# Patient Record
Sex: Female | Born: 1985 | Race: White | Hispanic: No | Marital: Married | Smoking: Never smoker
Health system: Southern US, Community
[De-identification: ages and names within clinical notes are randomized; demographics above are authoritative.]

## PROBLEM LIST (undated history)

## (undated) DIAGNOSIS — O24419 Gestational diabetes mellitus in pregnancy, unspecified control: Secondary | ICD-10-CM

## (undated) HISTORY — PX: OTHER SURGICAL HISTORY: SHX169

---

## 2009-12-27 ENCOUNTER — Emergency Department: Payer: Self-pay | Admitting: Emergency Medicine

## 2010-01-11 ENCOUNTER — Emergency Department: Payer: Self-pay | Admitting: Unknown Physician Specialty

## 2010-10-23 ENCOUNTER — Ambulatory Visit: Payer: Self-pay | Admitting: Family Medicine

## 2011-03-13 ENCOUNTER — Ambulatory Visit: Payer: Self-pay | Admitting: Obstetrics and Gynecology

## 2011-03-16 ENCOUNTER — Inpatient Hospital Stay: Payer: Self-pay | Admitting: Obstetrics and Gynecology

## 2012-10-20 ENCOUNTER — Ambulatory Visit: Payer: Self-pay | Admitting: Family Medicine

## 2012-11-04 ENCOUNTER — Observation Stay: Payer: Self-pay | Admitting: Obstetrics and Gynecology

## 2012-11-04 LAB — URINALYSIS, COMPLETE
Bacteria: NONE SEEN
Blood: NEGATIVE
Glucose,UR: NEGATIVE mg/dL (ref 0–75)
Ph: 6 (ref 4.5–8.0)
Protein: NEGATIVE
WBC UR: 1 /HPF (ref 0–5)

## 2012-11-04 LAB — CBC WITH DIFFERENTIAL/PLATELET
Basophil #: 0 10*3/uL (ref 0.0–0.1)
Eosinophil #: 0.1 10*3/uL (ref 0.0–0.7)
Lymphocyte #: 0.5 10*3/uL — ABNORMAL LOW (ref 1.0–3.6)
Lymphocyte %: 3.8 %
MCH: 30.5 pg (ref 26.0–34.0)
MCHC: 33.9 g/dL (ref 32.0–36.0)
MCV: 90 fL (ref 80–100)
Monocyte #: 0.6 x10 3/mm (ref 0.2–0.9)
Monocyte %: 5.2 %
Neutrophil #: 11.1 10*3/uL — ABNORMAL HIGH (ref 1.4–6.5)
Platelet: 279 10*3/uL (ref 150–440)
RBC: 4.21 10*6/uL (ref 3.80–5.20)

## 2012-11-04 LAB — COMPREHENSIVE METABOLIC PANEL
Albumin: 3.2 g/dL — ABNORMAL LOW (ref 3.4–5.0)
Alkaline Phosphatase: 103 U/L (ref 50–136)
Anion Gap: 8 (ref 7–16)
Bilirubin,Total: 0.2 mg/dL (ref 0.2–1.0)
Calcium, Total: 8.7 mg/dL (ref 8.5–10.1)
Creatinine: 0.52 mg/dL — ABNORMAL LOW (ref 0.60–1.30)
EGFR (African American): >60
EGFR (Non-African Amer.): >60
Osmolality: 268 (ref 275–301)
Potassium: 3.8 mmol/L (ref 3.5–5.1)
SGOT(AST): 20 U/L (ref 15–37)
SGPT (ALT): 18 U/L (ref 12–78)
Sodium: 136 mmol/L (ref 136–145)

## 2012-11-04 LAB — PROTIME-INR
INR: 1
Prothrombin Time: 13.5 secs (ref 11.5–14.7)

## 2012-11-04 LAB — TROPONIN I: Troponin-I: <0.02 ng/mL

## 2012-11-04 LAB — RAPID INFLUENZA A&B ANTIGENS

## 2012-11-05 LAB — CBC WITH DIFFERENTIAL/PLATELET
Eosinophil #: 0 10*3/uL (ref 0.0–0.7)
Eosinophil %: 0 %
HCT: 30.6 % — ABNORMAL LOW (ref 35.0–47.0)
HGB: 10.6 g/dL — ABNORMAL LOW (ref 12.0–16.0)
Lymphocyte #: 0.4 10*3/uL — ABNORMAL LOW (ref 1.0–3.6)
MCHC: 34.5 g/dL (ref 32.0–36.0)
MCV: 90 fL (ref 80–100)
Monocyte #: 0.2 x10 3/mm (ref 0.2–0.9)
Neutrophil #: 7.5 10*3/uL — ABNORMAL HIGH (ref 1.4–6.5)
Neutrophil %: 92.1 %
Platelet: 230 10*3/uL (ref 150–440)
RBC: 3.4 10*6/uL — ABNORMAL LOW (ref 3.80–5.20)
RDW: 13.2 % (ref 11.5–14.5)

## 2012-11-05 LAB — COMPREHENSIVE METABOLIC PANEL
Alkaline Phosphatase: 83 U/L (ref 50–136)
Calcium, Total: 7.9 mg/dL — ABNORMAL LOW (ref 8.5–10.1)
Chloride: 109 mmol/L — ABNORMAL HIGH (ref 98–107)
Co2: 20 mmol/L — ABNORMAL LOW (ref 21–32)
EGFR (Non-African Amer.): >60
Glucose: 162 mg/dL — ABNORMAL HIGH (ref 65–99)
Potassium: 3.4 mmol/L — ABNORMAL LOW (ref 3.5–5.1)
SGPT (ALT): 16 U/L (ref 12–78)
Sodium: 140 mmol/L (ref 136–145)
Total Protein: 6 g/dL — ABNORMAL LOW (ref 6.4–8.2)

## 2012-11-12 ENCOUNTER — Observation Stay: Payer: Self-pay | Admitting: Obstetrics and Gynecology

## 2012-11-12 LAB — CBC WITH DIFFERENTIAL/PLATELET
Basophil #: 0 10*3/uL (ref 0.0–0.1)
Basophil %: 0.1 %
Eosinophil #: 0.1 10*3/uL (ref 0.0–0.7)
Eosinophil %: 1 %
HGB: 11.5 g/dL — ABNORMAL LOW (ref 12.0–16.0)
Lymphocyte #: 2.2 10*3/uL (ref 1.0–3.6)
Lymphocyte %: 27.1 %
MCH: 29.1 pg (ref 26.0–34.0)
MCHC: 32.8 g/dL (ref 32.0–36.0)
MCV: 89 fL (ref 80–100)
Monocyte #: 0.6 x10 3/mm (ref 0.2–0.9)
Monocyte %: 8 %
Neutrophil #: 5.1 10*3/uL (ref 1.4–6.5)
Neutrophil %: 63.8 %
Platelet: 299 10*3/uL (ref 150–440)
RBC: 3.96 10*6/uL (ref 3.80–5.20)
WBC: 8 10*3/uL (ref 3.6–11.0)

## 2013-01-26 ENCOUNTER — Ambulatory Visit: Payer: Self-pay | Admitting: Obstetrics and Gynecology

## 2013-01-26 LAB — CBC WITH DIFFERENTIAL/PLATELET
Basophil #: 0.1 10*3/uL (ref 0.0–0.1)
Basophil %: 0.8 %
Eosinophil #: 0.1 10*3/uL (ref 0.0–0.7)
Eosinophil %: 1.1 %
HCT: 36.6 % (ref 35.0–47.0)
Lymphocyte #: 2 10*3/uL (ref 1.0–3.6)
MCHC: 33.2 g/dL (ref 32.0–36.0)
Monocyte #: 0.4 x10 3/mm (ref 0.2–0.9)
Monocyte %: 3.7 %
Neutrophil #: 8.9 10*3/uL — ABNORMAL HIGH (ref 1.4–6.5)
WBC: 11.5 10*3/uL — ABNORMAL HIGH (ref 3.6–11.0)

## 2013-01-27 ENCOUNTER — Inpatient Hospital Stay: Payer: Self-pay | Admitting: Obstetrics and Gynecology

## 2013-01-28 LAB — HEMATOCRIT: HCT: 32.5 % — ABNORMAL LOW (ref 35.0–47.0)

## 2014-04-20 ENCOUNTER — Ambulatory Visit: Payer: Self-pay | Admitting: Obstetrics and Gynecology

## 2014-04-20 LAB — CBC WITH DIFFERENTIAL/PLATELET
BASOS PCT: 0.7 %
Basophil #: 0.1 10*3/uL (ref 0.0–0.1)
EOS PCT: 0.9 %
Eosinophil #: 0.1 10*3/uL (ref 0.0–0.7)
HCT: 38.1 % (ref 35.0–47.0)
HGB: 12.3 g/dL (ref 12.0–16.0)
LYMPHS PCT: 18.5 %
Lymphocyte #: 2 10*3/uL (ref 1.0–3.6)
MCH: 28.5 pg (ref 26.0–34.0)
MCHC: 32.4 g/dL (ref 32.0–36.0)
MCV: 88 fL (ref 80–100)
Monocyte #: 0.5 x10 3/mm (ref 0.2–0.9)
Monocyte %: 5.1 %
NEUTROS PCT: 74.8 %
Neutrophil #: 8 10*3/uL — ABNORMAL HIGH (ref 1.4–6.5)
PLATELETS: 272 10*3/uL (ref 150–440)
RBC: 4.33 10*6/uL (ref 3.80–5.20)
RDW: 13.8 % (ref 11.5–14.5)
WBC: 10.6 10*3/uL (ref 3.6–11.0)

## 2014-04-23 ENCOUNTER — Inpatient Hospital Stay: Payer: Self-pay | Admitting: Obstetrics and Gynecology

## 2014-04-24 LAB — HEMATOCRIT: HCT: 33 % — ABNORMAL LOW (ref 35.0–47.0)

## 2015-01-18 NOTE — Op Note (Signed)
PATIENT NAME:  Susan Flowers, Susan Flowers MR#:  [id number] DATE OF BIRTH:  Sep 16, 1986  DATE OF PROCEDURE:  01/27/2013  PREOPERATIVE DIAGNOSES:  1.  Elective repeat cesarean section. 2.  Term gestation.   POSTOPERATIVE DIAGNOSES: 1.  Elective repeat cesarean section. 2.  Term gestation.   PROCEDURES:  1.  Repeat low transverse cesarean section.  2.  On-Q pump placement.   SURGEON: Suzy Bouchardhomas J. Obi Scrima, MD   FIRST ASSISTANT: Doman - Scrub Tech  ANESTHESIA: Spinal.   INDICATION: This is a 29 year old gravida 5 para 2 with EDC 01/29/2013, a patient with 2 previous C-sections, elected for repeat low transverse cesarean section.   DESCRIPTION OF PROCEDURE: After adequate spinal anesthesia, the patient was placed in the dorsal supine position with a hip roll under the right side. The patient's abdomen was prepped and draped in normal sterile fashion. The patient had previously received 3 grams intravenous Ancef. A Pfannenstiel incision was made 2 fingerbreadths above the symphysis pubis. Sharp dissection was used to identify the fascia. The fascia was opened in the midline and opened in a transverse fashion. Superior aspect of the fascia was grasped with Kocher clamps and the recti muscles dissected free. The inferior aspect of the fascia was grasped with Kocher clamps and the pyramidalis muscle was dissected free. Entry into the peritoneal cavity was accomplished sharply. An attenuated lower uterine segment was identified. Direct incision was made, low transverse incision.  Clear fluid resulted. Once gaining entrance into the endometrial cavity, the incision was extended with blunt transverse traction, fetal head brought to the incision and vacuum applied to the occiput. With one gentle pull with the vacuum delivery of the head was accomplished. The vacuum was removed and the shoulders and body delivered without difficulty. Cord was doubly clamped and vigorous female was passed to Dr. Beckie Saltsasnadi who  assigned Apgar scores of 9 and 9. The placenta was manually delivered. The uterus was exteriorized and the endometrial cavity was wiped clean with laparotomy tape.  A ring forceps was used to open the cervix and this was passed off the operative field. The uterine incision was closed with 1 chromic suture in a running locking fashion with good approximation of edges. Good hemostasis was noted. The fallopian tubes and ovaries appeared normal. The posterior cul-de-sac was irrigated and suctioned. The uterus was placed back into the abdominal cavity and the paracolic gutters were wiped clean with laparotomy tape. The uterine incision again appeared hemostatic and Interceed was placed over the uterine incision in a T-shaped fashion. The superior aspect of the fascia was grasped with Kocher clamps. On-Q pump catheters were advanced subfascially. The fascia was then closed over top of these with 0 Vicryl suture in a running nonlocking fashion with good approximation of edges. Subcutaneous tissues were irrigated and bovied for hemostasis. The skin was reapproximated with staples and the On-Q pump catheters were secured at the skin level with Dermabond and Steri-Stripped to the skin, sterile dressing applied, and each catheter was loaded with 5 mL of 0.5% Marcaine. There were no complications. Estimated blood loss 500 mL. Intraop fluids 1000 mL.  The patient tolerated the procedure well and was taken to the recovery room in good condition.  ____________________________ Suzy Bouchardhomas J. Gilda Abboud, MD tjs:sb D: 01/27/2013 08:47:02 ET T: 01/27/2013 09:19:59 ET JOB#: [id number]  cc: Suzy Bouchardhomas J. Aiko Belko, MD, <Dictator> Suzy BouchardHOMAS J Rafaella Kole MD ELECTRONICALLY SIGNED 01/30/2013 9:27

## 2015-01-18 NOTE — Discharge Summary (Signed)
PATIENT NAME:  Susan Flowers, Susan Flowers MR#:  [id number] DATE OF BIRTH:  [birth year]  DATE OF ADMISSION:  01/27/2013 DATE OF DISCHARGE:  01/29/2013  HOSPITAL COURSE: This 29 year old gravida 5, para 3, underwent elective repeat cesarean section and On-Q pump placement. The patient did well in the hospital. On postoperative day #1, hematocrit 32%. Vital signs remained stable. She was discharged to home in good condition. Will follow up with Dr. Feliberto GottronSchermerhorn in 2 weeks for wound care. Precautions were given to the patient.   DISCHARGE MEDICATIONS: Norco, Motrin or Aleve.   ____________________________ Suzy Bouchardhomas J. Jayleigh Notarianni, MD tjs:jm D: 02/13/2013 13:56:06 ET T: 02/13/2013 14:45:00 ET JOB#: [id number]  cc: Suzy Bouchardhomas J. Shanara Schnieders, MD, <Dictator> Suzy BouchardHOMAS J Aaran Enberg MD ELECTRONICALLY SIGNED 02/18/2013 8:38

## 2015-01-19 NOTE — Op Note (Signed)
PATIENT NAME:  Susan Flowers, Susan Flowers MR#:  [id number] DATE OF BIRTH:  03/06/1986  DATE OF PROCEDURE:  04/23/2014  PREOPERATIVE DIAGNOSIS:  Elective repeat cesarean section.   POSTOPERATIVE DIAGNOSIS: Elective repeat cesarean section.   PROCEDURE:  Repeat low transverse cesarean section.   ANESTHESIA: Spinal.   SURGEON: Jennell Cornerhomas Schermerhorn, M.D.   INDICATIONS: The patient is a 29 year old gravida 5 para 3 with EDC of 04/27/2014. The patient elects for a repeat cesarean section. Initially she decided on a bilateral tubal ligation, but canceled that request today. Again, patient declines permanent sterilization on the day of the procedure.   DESCRIPTION OF PROCEDURE: After adequate spinal anesthesia, the patient was placed in the dorsal supine position with a hip roll under the right side. The patient's abdomen was prepped and draped in normal sterile fashion. She did receive 3 grams IV Ancef prior to commencement of the case. A Pfannenstiel incision was made 2 fingerbreadths above the symphysis pubis. Sharp dissection was used to identify the fascia. The fascia was opened in the midline and opened in a transverse fashion. The superior aspect of the fascia was grasped with Kocher clamps and the rectus muscle were dissected free. The anterior aspect of the fascia was grasped with Coker clamps and the pyramidalis muscle was dissected free. Entry into the peritoneal cavity was accomplished sharply. The vesicouterine peritoneal fold was identified and a bladder flap was created and the bladder was reflected inferiorly.   A low transverse uterine incision was made. Upon entry into the endometrial cavity clear fluid resulted. The uterine incision was extended with blunt transverse traction. The fetal head was brought to the incision and a large fetal head was noted. A vacuum was applied to the occiput. With one gentle pull with the vacuum the fetal head was delivered and the vacuum was removed. A loose  nuchal cord was identified and reduced. A large female infant was then delivered through the uterus without difficulty. A vigorous crying female was passed to nursery staff who assigned Apgar scores of 10 and 10. Intravenous Pitocin was administered while the uterus was exteriorized after removing the placenta. The endometrial cavity was wiped clean with laparotomy tape and the cervix was opened with a ring forceps and this was passed off the operative field.   The uterine incision was closed with 1-chromic suture in a running locking fashion and a second row of sutures were placed to reinforce the attenuated lower uterine segment. Good hemostasis was noted. Fallopian tubes and ovaries appeared normal. The posterior cul-de-sac was irrigated and suctioned and the uterus was placed back into the abdominal cavity. The uterine incision again appeared hemostatic after wiping clean the paracolic gutters. Interceed was placed over the uterine incision. Fascia was then closed with 0-Vicryl suture in a running nonlocking fashion with good approximation of edges. Good hemostasis was noted. Subcutaneous tissues were irrigated and Bovied, given the depth of the subcutaneous tissue. The tissue was closed with running 2-0 chromic to close dead space. The skin was then reapproximated with staples. There were no complications.   ESTIMATED BLOOD LOSS:  600 mL.   INTRAOPERATIVE FLUIDS: 1600 mL.   URINE OUTPUT:  250 mL.    The patient tolerated the procedure and was taken to the recovery room in good condition.    ____________________________ Suzy Bouchardhomas J. Schermerhorn, MD tjs:lt D: 04/23/2014 08:31:50 ET T: 04/23/2014 08:49:17 ET JOB#: [id number]  cc: Suzy Bouchardhomas J. Schermerhorn, MD, <Dictator> Suzy BouchardHOMAS J SCHERMERHORN MD ELECTRONICALLY SIGNED 04/23/2014 17:02

## 2015-02-05 NOTE — H&P (Signed)
L&D Evaluation:  History:   HPI 29 yo G5P2 with 2 day h/o SOB. Fever started today as high as 103. She presented to ED today. [facility]. Son just dx with influeza( ?Type).    Patient's Medical History No Chronic Illness    Patient's Surgical History c/s x2    Medications Pre Natal Vitamins    Allergies NKDA    Social History none    Family History Non-Contributory   ROS:   ROS All systems were reviewed.  HEENT, CNS, GI, GU, Respiratory, CV, Renal and Musculoskeletal systems were found to be normal., except pulm = SOB   Exam:   Vital Signs stable  Temp \\99 .5, pulse 120    General no apparent distress    Mental Status clear    Chest clear    Heart tachy    Abdomen gravid, non-tender   Impression:   Impression [redacted] week gestation with OB and fever. FAmily hx c/w influenza   Plan:   Plan admit. Supportive care D%LR at 100 cc/ hr , 1000mg  tylenol q 6 hours. Tamiflu 75 mg bid x 5 days. isolation. FHMonitoring.  betamethasone x2 doses   Electronic Signatures: Schermerhorn, Ihor Austinhomas J (MD)  (Signed 07-Feb-14 17:12)  Authored: L&D Evaluation   Last Updated: 07-Feb-14 17:12 by Suzy BouchardSchermerhorn, Thomas J (MD)

## 2015-04-07 ENCOUNTER — Emergency Department
Admission: EM | Admit: 2015-04-07 | Discharge: 2015-04-08 | Disposition: A | Discharge status: Other Acute Inpatient Hospital | Payer: No Typology Code available for payment source | Attending: Emergency Medicine | Admitting: Emergency Medicine

## 2015-04-07 ENCOUNTER — Other Ambulatory Visit: Payer: Self-pay

## 2015-04-07 ENCOUNTER — Encounter: Payer: Self-pay | Admitting: Emergency Medicine

## 2015-04-07 DIAGNOSIS — Y9389 Activity, other specified: Secondary | ICD-10-CM | POA: Insufficient documentation

## 2015-04-07 DIAGNOSIS — T391X2A Poisoning by 4-Aminophenol derivatives, intentional self-harm, initial encounter: Secondary | ICD-10-CM | POA: Insufficient documentation

## 2015-04-07 DIAGNOSIS — T50902A Poisoning by unspecified drugs, medicaments and biological substances, intentional self-harm, initial encounter: Secondary | ICD-10-CM

## 2015-04-07 DIAGNOSIS — Y9289 Other specified places as the place of occurrence of the external cause: Secondary | ICD-10-CM | POA: Insufficient documentation

## 2015-04-07 DIAGNOSIS — Y998 Other external cause status: Secondary | ICD-10-CM | POA: Insufficient documentation

## 2015-04-07 DIAGNOSIS — Z3202 Encounter for pregnancy test, result negative: Secondary | ICD-10-CM | POA: Insufficient documentation

## 2015-04-07 DIAGNOSIS — T423X2A Poisoning by barbiturates, intentional self-harm, initial encounter: Secondary | ICD-10-CM | POA: Insufficient documentation

## 2015-04-07 DIAGNOSIS — T39312A Poisoning by propionic acid derivatives, intentional self-harm, initial encounter: Secondary | ICD-10-CM | POA: Insufficient documentation

## 2015-04-07 DIAGNOSIS — T43612A Poisoning by caffeine, intentional self-harm, initial encounter: Secondary | ICD-10-CM | POA: Insufficient documentation

## 2015-04-07 DIAGNOSIS — F329 Major depressive disorder, single episode, unspecified: Secondary | ICD-10-CM | POA: Insufficient documentation

## 2015-04-07 HISTORY — DX: Gestational diabetes mellitus in pregnancy, unspecified control: O24.419

## 2015-04-07 LAB — COMPREHENSIVE METABOLIC PANEL
ALT: 20 U/L (ref 14–54)
AST: 26 U/L (ref 15–41)
Albumin: 4.1 g/dL (ref 3.5–5.0)
Alkaline Phosphatase: 48 U/L (ref 38–126)
Anion gap: 10 (ref 5–15)
BILIRUBIN TOTAL: 0.4 mg/dL (ref 0.3–1.2)
BUN: 11 mg/dL (ref 6–20)
CHLORIDE: 103 mmol/L (ref 101–111)
CO2: 25 mmol/L (ref 22–32)
CREATININE: 0.7 mg/dL (ref 0.44–1.00)
Calcium: 8.7 mg/dL — ABNORMAL LOW (ref 8.9–10.3)
GFR calc Af Amer: >60 mL/min (ref >60)
GLUCOSE: 90 mg/dL (ref 65–99)
POTASSIUM: 3.6 mmol/L (ref 3.5–5.1)
Sodium: 138 mmol/L (ref 135–145)
Total Protein: 7.1 g/dL (ref 6.5–8.1)

## 2015-04-07 LAB — CBC
HCT: 40 % (ref 35.0–47.0)
Hemoglobin: 13.4 g/dL (ref 12.0–16.0)
MCH: 30 pg (ref 26.0–34.0)
MCHC: 33.6 g/dL (ref 32.0–36.0)
MCV: 89.3 fL (ref 80.0–100.0)
Platelets: 315 10*3/uL (ref 150–440)
RBC: 4.48 MIL/uL (ref 3.80–5.20)
RDW: 12.9 % (ref 11.5–14.5)
WBC: 7.5 10*3/uL (ref 3.6–11.0)

## 2015-04-07 LAB — ACETAMINOPHEN LEVEL
ACETAMINOPHEN (TYLENOL), SERUM: 50 ug/mL — AB (ref 10–30)
Acetaminophen (Tylenol), Serum: 28 ug/mL (ref 10–30)

## 2015-04-07 LAB — SALICYLATE LEVEL: Salicylate Lvl: <4 mg/dL (ref 2.8–30.0)

## 2015-04-07 LAB — ETHANOL

## 2015-04-07 NOTE — ED Notes (Addendum)
Report given to Dewayne HatchAnn, RN, per Dr York CeriseForbach pt is stable for transfer to quad

## 2015-04-07 NOTE — ED Notes (Signed)
Per poison control ," give fluids for hydration, 830 tylenol level, electrolyte and renal function", GI, renal support

## 2015-04-07 NOTE — ED Notes (Signed)
Pt arrived via EMS from home. Per EMS patient overdosed on 80-200 mb ibuprofen and 20 butalb-acetamin-caff. Pt drwosy on arrival but able to answer questions. No acute distress .
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
## 2015-04-07 NOTE — ED Provider Notes (Signed)
-----------------------------------------   11:13 PM on 04/07/2015 -----------------------------------------   BP 114/75 mmHg  Pulse 97  Temp(Src) 98.4 F (36.9 C) (Oral)  Resp 17  Ht 5\' 6"  (1.676 m)  Wt 228 lb 6.4 oz (103.602 kg)  BMI 36.88 kg/m2  SpO2 96%  Second Tylenol level is going down.  Somnolent but hemodynamically stable.  No additional acute medical concerns at this time.  Will move to Quad pending psych eval.  Once in quad will d/c bedside sitter.  Loleta Roseory Ashmi Blas, MD 04/07/15 2314

## 2015-04-08 ENCOUNTER — Encounter: Payer: Self-pay | Admitting: Psychiatry

## 2015-04-08 ENCOUNTER — Inpatient Hospital Stay
Admission: EM | Admit: 2015-04-08 | Discharge: 2015-04-12 | DRG: 885 | Disposition: A | Discharge status: Home / Self Care | Payer: No Typology Code available for payment source | Source: Intra-hospital | Attending: Psychiatry | Admitting: Psychiatry

## 2015-04-08 DIAGNOSIS — Z630 Problems in relationship with spouse or partner: Secondary | ICD-10-CM | POA: Diagnosis not present

## 2015-04-08 DIAGNOSIS — T1491 Suicide attempt: Secondary | ICD-10-CM | POA: Diagnosis present

## 2015-04-08 DIAGNOSIS — G4700 Insomnia, unspecified: Secondary | ICD-10-CM | POA: Diagnosis present

## 2015-04-08 DIAGNOSIS — T50992A Poisoning by other drugs, medicaments and biological substances, intentional self-harm, initial encounter: Secondary | ICD-10-CM | POA: Diagnosis present

## 2015-04-08 DIAGNOSIS — T39312A Poisoning by propionic acid derivatives, intentional self-harm, initial encounter: Secondary | ICD-10-CM | POA: Diagnosis present

## 2015-04-08 DIAGNOSIS — Z9114 Patient's other noncompliance with medication regimen: Secondary | ICD-10-CM | POA: Diagnosis present

## 2015-04-08 DIAGNOSIS — F332 Major depressive disorder, recurrent severe without psychotic features: Principal | ICD-10-CM | POA: Diagnosis present

## 2015-04-08 DIAGNOSIS — Z8632 Personal history of gestational diabetes: Secondary | ICD-10-CM

## 2015-04-08 LAB — URINE DRUG SCREEN, QUALITATIVE (ARMC ONLY)
Amphetamines, Ur Screen: NOT DETECTED
BENZODIAZEPINE, UR SCRN: NOT DETECTED
Barbiturates, Ur Screen: POSITIVE — AB
CANNABINOID 50 NG, UR ~~LOC~~: NOT DETECTED
COCAINE METABOLITE, UR ~~LOC~~: NOT DETECTED
MDMA (Ecstasy)Ur Screen: NOT DETECTED
Methadone Scn, Ur: NOT DETECTED
Opiate, Ur Screen: NOT DETECTED
Phencyclidine (PCP) Ur S: NOT DETECTED
Tricyclic, Ur Screen: NOT DETECTED

## 2015-04-08 LAB — POCT PREGNANCY, URINE: PREG TEST UR: NEGATIVE

## 2015-04-08 MED ORDER — ALUM & MAG HYDROXIDE-SIMETH 200-200-20 MG/5ML PO SUSP: 30.0000 mL | ORAL | Status: DC | PRN

## 2015-04-08 MED ORDER — MAGNESIUM HYDROXIDE 400 MG/5ML PO SUSP
30.0000 mL | Freq: Every day | ORAL | Status: DC | PRN
  Administered 2015-04-10 – 2015-04-11 (×2): 30 mL via ORAL
  Filled 2015-04-08 (×2): qty 30

## 2015-04-08 MED ORDER — TRAZODONE HCL 100 MG PO TABS: 100.0000 mg | ORAL_TABLET | Freq: Every evening | ORAL | Status: DC | PRN

## 2015-04-08 NOTE — ED Notes (Signed)
Report to Sherilyn CooterHenry, RN and pt moved to ED BHU.

## 2015-04-08 NOTE — ED Notes (Signed)
Patient assigned to appropriate care area. Patient oriented to unit/care area: Informed that, for their safety, care areas are designed for safety and monitored by security cameras at all times; and visiting hours explained to patient. Patient verbalizes understanding, and verbal contract for safety obtained. 

## 2015-04-08 NOTE — ED Notes (Signed)
Patient resting, denies hunger, various foods offered without success. Phone call from husband received by nurse, patient states she does not wish to have phone calls at this point.

## 2015-04-08 NOTE — ED Notes (Signed)
BEHAVIORAL HEALTH ROUNDING Patient sleeping: No. Patient alert and oriented: yes Behavior appropriate: Yes.  ; If no, describe:  Nutrition and fluids offered: Yes  Toileting and hygiene offered: Yes  Sitter present: not applicable Law enforcement present: Yes  

## 2015-04-08 NOTE — ED Provider Notes (Signed)
-----------------------------------------   6:31 AM on 04/08/2015 -----------------------------------------   BP 108/69 mmHg  Pulse 97  Temp(Src) 98.4 F (36.9 C) (Oral)  Resp 19  Ht 5\' 6"  (1.676 m)  Wt 228 lb 6.4 oz (103.602 kg)  BMI 36.88 kg/m2  SpO2 96%  The patient had no acute events since last update.  Calm and cooperative at this time.  Disposition is pending per Psychiatry/Behavioral Medicine team recommendations.     Irean HongJade J Faraaz Wolin, MD 04/08/15 [phone]

## 2015-04-08 NOTE — Progress Notes (Signed)
Patient was isolative in room at the beginning of the shift. Was encouraged to go to the dayroom for a snack and activities. Pt went to the dayroom and currently interacting appropriately with staff and peers. Denies SI/HI/hallucinations. Has no concern so far. Support and encouragements offered and safety maintained.
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
## 2015-04-08 NOTE — ED Notes (Signed)

## 2015-04-08 NOTE — ED Notes (Signed)
BEHAVIORAL HEALTH ROUNDING  Patient sleeping: No.  Patient alert and oriented: yes  Behavior appropriate: Yes. ; If no, describe:  Nutrition and fluids offered: Yes  Toileting and hygiene offered: Yes  Sitter present: not applicable  Law enforcement present: Yes ODS  

## 2015-04-08 NOTE — Plan of Care (Signed)
Problem: Consults [facility] Patient Education Outcome: Progressing Patient denies SI/HI at this time. Safety maintained.

## 2015-04-08 NOTE — ED Notes (Signed)
BEHAVIORAL HEALTH ROUNDING Patient sleeping: Yes.   Patient alert and oriented: sleeping Behavior appropriate: sleeping Nutrition and fluids offered: sleeping Toileting and hygiene offered: sleeping Sitter present: yes Law enforcement present: Yes  

## 2015-04-08 NOTE — ED Notes (Signed)

## 2015-04-08 NOTE — Progress Notes (Signed)
Patient arrives on unit and is calm and cooperative. Depressed affect and cooperative behavior with skin assessment. No wounds found. No contraband found on patient. Patient has clothes in bag and writer noted 2 empty pill bottles in bag. Denies SI/HI/AVH at this time. Patient with no complaint or requests at this time. Patient to room with safety maintained.

## 2015-04-08 NOTE — ED Notes (Signed)
Meal was given to patient.. 

## 2015-04-08 NOTE — ED Notes (Signed)
BEHAVIORAL HEALTH ROUNDING Patient sleeping: Yes.   Patient alert and oriented: not appicable Behavior appropriate: Yes.  ; If no, describe:  Nutrition and fluids offered: No Toileting and hygiene offered: No Sitter present: not applicable Law enforcement present: Yes

## 2015-04-08 NOTE — ED Notes (Signed)
Pt transported to [facility] by Sherilyn CooterHenry RN and [facility] without difficulty.

## 2015-04-09 DIAGNOSIS — F332 Major depressive disorder, recurrent severe without psychotic features: Principal | ICD-10-CM

## 2015-04-09 LAB — VITAMIN B12: Vitamin B-12: 943 pg/mL — ABNORMAL HIGH (ref 180–914)

## 2015-04-09 LAB — TSH: TSH: 1.142 u[IU]/mL (ref 0.350–4.500)

## 2015-04-09 MED ORDER — TRAZODONE HCL 50 MG PO TABS
50.0000 mg | ORAL_TABLET | Freq: Every day | ORAL | Status: DC
Start: 2015-04-09 — End: 2015-04-10
  Administered 2015-04-09: 50 mg via ORAL
  Filled 2015-04-09: qty 1

## 2015-04-09 MED ORDER — SERTRALINE HCL 50 MG PO TABS
50.0000 mg | ORAL_TABLET | Freq: Every day | ORAL | Status: DC
  Administered 2015-04-09 – 2015-04-12 (×4): 50 mg via ORAL
  Filled 2015-04-09 (×4): qty 1
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
## 2015-04-09 NOTE — Plan of Care (Signed)
Problem: Ineffective individual coping Goal: LTG: Patient will report a decrease in negative feelings Outcome: Not Progressing Continues to express sadness, tearful and expressing feelings of helplessness and worthlessness
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
## 2015-04-09 NOTE — [facility] Suicide Risk Assessment (Signed)
[facility]   Nursing information obtained from:    Demographic factors:    Current Mental Status:    Loss Factors:    Historical Factors:    Risk Reduction Factors:    Total Time spent with patient: 1 hour Principal Problem: Major depressive disorder, recurrent severe without psychotic features Diagnosis:   Patient Active Problem List   Diagnosis Date Noted  . Major depressive disorder, recurrent severe without psychotic features [F33.2] 04/08/2015     Continued Clinical Symptoms:  Alcohol Use Disorder Identification Test Final Score (AUDIT): 0 The "Alcohol Use Disorders Identification Test", Guidelines for Use in Primary Care, Second Edition.  World Science writerHealth Organization [facility](WHO). Score between 0-7:  no or low risk or alcohol related problems. Score between 8-15:  moderate risk of alcohol related problems. Score between 16-19:  high risk of alcohol related problems. Score 20 or above:  warrants further diagnostic evaluation for alcohol dependence and treatment.   CLINICAL FACTORS:   Severe Anxiety and/or Agitation Depression:   Impulsivity     Psychiatric Specialty Exam: Physical Exam  ROS   COGNITIVE FEATURES THAT CONTRIBUTE TO RISK:  None    SUICIDE RISK:   Moderate:  Frequent suicidal ideation with limited intensity, and duration, some specificity in terms of plans, no associated intent, good self-control, limited dysphoria/symptomatology, some risk factors present, and identifiable protective factors, including available and accessible social support.  [facility]  Medical Decision Making:  Established Problem, Stable/Improving (1)  I certify that inpatient services furnished can reasonably be expected to improve the patient's condition.   Jimmy FootmanHernandez-Gonzalez,  Rucker Pridgeon 04/09/2015, 11:26 AM

## 2015-04-09 NOTE — Plan of Care (Signed)
Problem: Ineffective individual coping Goal: LTG: Patient will report a decrease in negative feelings Outcome: Progressing Patient more interactive with staff and peers with decreased anxiety and depression
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
## 2015-04-09 NOTE — [facility] Suicide Risk Assessment (Signed)
[facility] INPATIENT:  Family/Significant Other Suicide Prevention Education  Suicide Prevention Education:  Education Completed;Johnny Corliss BlackerMcNeill has been identified by the patient as the family member/significant other with whom the patient will be residing, and identified as the person(s) who will aid the patient in the event of a mental health crisis (suicidal ideations/suicide attempt).  With written consent from the patient, the family member/significant other has been provided the following suicide prevention education, prior to the and/or following the discharge of the patient.  The suicide prevention education provided includes the following:  Suicide risk factors  Suicide prevention and interventions  National Suicide Hotline telephone number  [facility] assessment telephone number  [facility] Emergency Assistance 911  [facility] and/or [facility]idential Mobile Crisis Unit telephone number  Request made of family/significant other to:  Remove weapons (e.g., guns, rifles, knives), all items previously/currently identified as safety concern.    Remove drugs/medications (over-the-counter, prescriptions, illicit drugs), all items previously/currently identified as a safety concern.  The family member/significant other verbalizes understanding of the suicide prevention education information provided.  The family member/significant other agrees to remove the items of safety concern listed above.  Johnella MoloneyBandi, Fabiana Dromgoole M 04/09/2015, 4:20 PM

## 2015-04-09 NOTE — [facility] Group Notes (Signed)
[facility] Group Notes:  (Nursing/MHT/Case Management/Adjunct)  Date:  04/09/2015  Time:  2:32 PM  Type of Therapy:  Psychoeducational Skills  Participation Level:  Active  Participation Quality:  Appropriate, Attentive and Sharing  Affect:  Appropriate  Cognitive:  Alert and Appropriate  Insight:  Appropriate  Engagement in Group:  Engaged  Modes of Intervention:  Discussion, Education and Support  Summary of Progress/Problems:  Susan Flowers 04/09/2015, 2:32 PM

## 2015-04-09 NOTE — Progress Notes (Signed)
Recreation Therapy Notes  Date: 07.12.16 Time: 3:15 Location: Craft Room  Group Topic: Self-expression  Goal Area(s) Addresses:  Patient will identify one color per emotion listed on wheel. Patient will verbalize benefit of using art as a means of self-expression. Patient will verbalize one emotion experienced during session. Patient will be educated on other forms of self-expression.  Behavioral Response: Did not attend  Intervention: Emotion Wheel  Activity: Patients were given a worksheet with 7 emotions on it. Patients were instructed to pick a color for each emotion.  Education: LRT educated patients on different forms of self-expression.  Education Outcome: Patient did not attend group.  Clinical Observations/Feedback: Patient did not attend group.  Jacquelynn CreeGreene,Aline Wesche M, LRT/CTRS 04/09/2015 4:19 PM

## 2015-04-09 NOTE — Progress Notes (Signed)
[facility] LCSW Group Therapy  04/09/2015 1:16 PM  Type of Therapy:  Group Therapy  Participation Level:  Did Not Attend  Participation Quality:    Affect:    Cognitive:    Insight:    Engagement in Therapy:    Modes of Intervention:    Summary of Progress/Problems:  Cheron SchaumannBandi, Arlin Sass M 04/09/2015, 1:16 PM

## 2015-04-10 MED ORDER — TRAZODONE HCL 50 MG PO TABS: 50.0000 mg | ORAL_TABLET | Freq: Every evening | ORAL | Status: DC | PRN

## 2015-04-10 MED ORDER — TRAZODONE HCL 100 MG PO TABS
100.0000 mg | ORAL_TABLET | Freq: Every day | ORAL | Status: DC
  Administered 2015-04-10: 100 mg via ORAL
  Filled 2015-04-10: qty 1

## 2015-04-10 NOTE — Tx Team (Signed)
Initial Interdisciplinary Treatment Plan   PATIENT STRESSORS: Marital or family conflict   PATIENT STRENGTHS: Ability for insight Average or above average intelligence Communication skills   PROBLEM LIST: Problem List/Patient Goals Date to be addressed Date deferred Reason deferred Estimated date of resolution  Marital problems      Sadness      "improved mood"                                           DISCHARGE CRITERIA:  Improved stabilization in mood, thinking, and/or behavior Verbal commitment to aftercare and medication compliance  PRELIMINARY DISCHARGE PLAN: Outpatient therapy Return to previous living arrangement  PATIENT/FAMIILY INVOLVEMENT: This treatment plan has been presented to and reviewed with the patient, Susan Flowers, and/or family member, .  The patient and family have been given the opportunity to ask questions and make suggestions.  Ezrael Sam B 04/10/2015, 11:39 AM

## 2015-04-10 NOTE — Plan of Care (Signed)
Problem: Ineffective individual coping Goal: LTG: Patient will report a decrease in negative feelings Outcome: Progressing Denies SI or depression

## 2015-04-10 NOTE — Progress Notes (Signed)
Recreation Therapy Notes  Date: 07.12.16 Time: 3:00 pm Location: Craft Room  Group Topic: Self-esteem  Goal Area(s) Addresses:  Patient will write at least one positive trait about self. Patient will verbalize benefit of having a good self-esteem.  Behavioral Response: Attentive, Interactive   Intervention: I Am  Activity: Patients were given a worksheet with the letter I and instructed to fill the letter with positive traits about themselves.  Education: LRT educated patients on different ways to increase self-esteem   Education Outcome: Acknowledges education/In group clarification offered  Clinical Observations/Feedback: Patient completed worksheet by listing positive traits about herself. Patient contributed to group discussion by stating how it felt to see her strengths on paper, how her self-esteem affects her, and how she can increase her self-esteem.  Jacquelynn CreeGreene,Rome Schlauch M, LRT/CTRS 04/10/2015 4:09 PM

## 2015-04-10 NOTE — Progress Notes (Signed)
Pleasant and cooperative.  Smiles readily.  Denies SI.  Interacting appropriately on the unit.  Safety maintained.

## 2015-04-10 NOTE — [facility] Group Notes (Signed)
[facility] Group Notes:  (Nursing/MHT/Case Management/Adjunct)  Date:  04/10/2015  Time:  3:59 PM  Type of Therapy:  Psychoeducational Skills  Participation Level:  Active  Participation Quality:  Appropriate, Sharing and Supportive  Affect:  Appropriate and Tearful  Cognitive:  Alert and Appropriate  Insight:  Appropriate and Good  Engagement in Group:  Engaged and Supportive  Modes of Intervention:  Discussion, Education and Support  Summary of Progress/Problems:  Susan SmokeCara Travis Brandt Flowers 04/10/2015, 3:59 PM

## 2015-04-10 NOTE — [facility] Group Notes (Signed)
[facility] LCSW Group Therapy  04/10/2015 3:15 PM  Type of Therapy:  Group Therapy  Participation Level:  Did Not Attend  Patient was asleep and very apologetic once she woke up.  Participation Quality:    Affect:    Cognitive:    Insight:    Engagement in Therapy:    Modes of Intervention:    Summary of Progress/Problems:  Cheron SchaumannBandi, Susan Flowers 04/10/2015, 3:15 PM

## 2015-04-10 NOTE — Plan of Care (Signed)
Problem: [facility] Participation in Recreation Therapeutic Interventions Goal: STG-Patient will demonstrate improved self esteem by identif STG: Self-Esteem - Within 4 treatment sessions, patient will verbalize at least 5 positive affirmation statements in each of 2 treatment sessions to increase self-esteem post d/c.  Outcome: Progressing Treatment Session 1; Completed 1 out of 2: At approximately 12:45 pm, LRT met with patient in craft room. Patient verbalized 5 positive affirmation statements. Patient reported it felt good. LRT encouraged patient to continue saying positive affirmation statements.   Leonette Monarch, LRT/CTRS 07.13.16 1:44 pm Goal: STG-Other Recreation Therapy Goal (Specify) STG: Time Management - Within 4 treatment sessions, patient will verbalize understanding of scheduling in each of 2 treatment sessions to increase time management skills post d/c.  Outcome: Progressing Treatment Session 1; Completed 1 out of 2: At approximately 12:45 pm, LRT met with patient in craft room. LRT educated patient on schedules. Patient verbalized understanding. LRT encouraged patient to use the schedules to help her with time management.  Leonette Monarch, LRT/CTRS 07.13.16 1:45 pm

## 2015-04-10 NOTE — [facility] Group Notes (Signed)
[facility]  04/10/2015 3:16 PM  Participation Quality:  Attentive  Affect:  Appropriate  Cognitive:  Appropriate  Insight:  Engaged  Engagement in Group:  Engaged  Modes of Intervention:  Discussion  Summary of Progress/Problems: Patient goal is to focus on self care. Several peers shared ideas of what she could do.- Naps, reading, eating and sleeping better, she reported she did not sleep well last night. She was encouraged to nap if this helped.  Susan SenateBandi, Susan Flowers 04/10/2015, 3:16 PM

## 2015-04-10 NOTE — Progress Notes (Signed)
D: Pt is awake and active in the milieu this evening. Pt mood is appropriate and her affect is flat.   A: Writer provided emotional support and encouraged pt to continue taking medications as prescribed.  R: Pt is attending groups and is pleasant and cooperative with staff. Pt is playing cards with her peers and interacting appropriately on the unit. She went to bed shortly after medication administration.

## 2015-04-11 MED ORDER — HYDROXYZINE HCL 50 MG PO TABS
50.0000 mg | ORAL_TABLET | Freq: Every evening | ORAL | Status: DC | PRN
Start: 2015-04-11 — End: 2015-04-11

## 2015-04-11 MED ORDER — SERTRALINE HCL 100 MG PO TABS: 100.0000 mg | ORAL_TABLET | Freq: Every day | ORAL | Status: DC

## 2015-04-11 MED ORDER — HYDROXYZINE HCL 50 MG PO TABS: 50.0000 mg | ORAL_TABLET | Freq: Every evening | ORAL | Status: AC | PRN

## 2015-04-11 MED ORDER — SERTRALINE HCL 100 MG PO TABS: 100.0000 mg | ORAL_TABLET | Freq: Every day | ORAL | Status: AC

## 2015-04-11 MED ORDER — POLYETHYLENE GLYCOL 3350 17 G PO PACK
17.0000 g | PACK | Freq: Every day | ORAL | Status: DC
  Administered 2015-04-11: 17 g via ORAL
  Filled 2015-04-11 (×2): qty 1

## 2015-04-11 MED ORDER — HYDROXYZINE HCL 50 MG PO TABS
50.0000 mg | ORAL_TABLET | Freq: Every day | ORAL | Status: DC
  Administered 2015-04-11: 50 mg via ORAL
  Filled 2015-04-11: qty 1
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
## 2015-04-11 NOTE — [facility] Group Notes (Signed)
[facility] Group Notes:  (Nursing/MHT/Case Management/Adjunct)  Date:  04/11/2015  Time:  11:24 PM  Type of Therapy:  Group Therapy  Participation Level:  Active  Participation Quality:  Appropriate and Attentive  Affect:  Appropriate  Cognitive:  Alert and Appropriate  Insight:  Appropriate  Engagement in Group:  Engaged  Modes of Intervention:  Discussion  Summary of Progress/Problems:  Susan Flowers 04/11/2015, 11:24 PM
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
## 2015-04-11 NOTE — Progress Notes (Signed)
Patient ID: Susan LinesCarol O Jackson McNeill, female   DOB: 1986/09/21, 29 y.o.   MRN: [id number]  CSW completed CPS report.   Lincolnandace Vaneza Pickart, MSW, [facility] IncCSWA  04/11/2015 5:25 PM

## 2015-04-11 NOTE — Plan of Care (Signed)
Problem: [facility] Participation in Recreation Therapeutic Interventions Goal: STG-Patient will demonstrate improved self esteem by identif STG: Self-Esteem - Within 4 treatment sessions, patient will verbalize at least 5 positive affirmation statements in each of 2 treatment sessions to increase self-esteem post d/c.  Outcome: Completed/Met Date Met:  04/11/15 Treatment Session 2; Completed 2 out of 2: At approximately 9:50 am, LRT met with patient in consultation room. Patient verbalized more than 5 positive affirmation statements. Patient reported it felt "good". LRT encouraged patient to continue saying positive affirmation statements.   Leonette Monarch, LRT/CTRS 07.14.16 11:54 am Goal: STG-Other Recreation Therapy Goal (Specify) STG: Time Management - Within 4 treatment sessions, patient will verbalize understanding of scheduling in each of 2 treatment sessions to increase time management skills post d/c.  Outcome: Completed/Met Date Met:  04/11/15 Treatment Session 2; Completed 2 out of 2: At approximately 9:50 am, LRT met with patient in consultation room. Patient verbalized understanding of the scheduling. Patient told LRT of other ideas she had to help her stay on schedule. LRT encouraged patient to use the techniques.  Leonette Monarch, LRT/CTRS 07.14.16 11:56 am

## 2015-04-11 NOTE — [facility] Group Notes (Signed)
[facility] Group Notes:  (Nursing/MHT/Case Management/Adjunct)  Date:  04/11/2015  Time:  2:58 PM  Type of Therapy:  Psychoeducational Skills  Participation Level:  Active  Participation Quality:  Appropriate, Attentive and Sharing  Affect:  Appropriate  Cognitive:  Alert and Appropriate  Insight:  Appropriate  Engagement in Group:  Engaged  Modes of Intervention:  Discussion, Education and Support  Summary of Progress/Problems:  Susan Flowers [facility]Mad[facility] 04/11/2015, 2:58 PM

## 2015-04-11 NOTE — Progress Notes (Signed)
D: Patient denies SI/HI/AVH. Patient affect brighter today smiling talking with peers. Patient did attend Groups today.  No distress noted. A: Support and encouragement offered. Scheduled medications given to pt. Q 15 min checks continued for patient, safety maintained on unit.

## 2015-04-11 NOTE — [facility] Group Notes (Signed)
[facility] Group Notes:  (Nursing/MHT/Case Management/Adjunct)  Date:  04/11/2015  Time:  5:06 AM  Type of Therapy:  Group Therapy  Participation Level:  Did Not Attend    Summary of Progress/Problems:  Veva Holesshley Imani Guerry Covington 04/11/2015, 5:06 AM

## 2015-04-11 NOTE — Progress Notes (Signed)
Recreation Therapy Notes  INPATIENT RECREATION TR PLAN  Patient Details Name: Susan Flowers MRN: [id number] DOB: 07-06-1986 Today's Date: 04/11/2015  Rec Therapy Plan Is patient appropriate for Therapeutic Recreation?: Yes Treatment times per week: At least 2 times a week TR Treatment/Interventions: 1:1 session, Group participation (Comment) (Appropriate participation in daily recreation therapy tx)  Discharge Criteria Pt will be discharged from therapy if:: Discharged Treatment plan/goals/alternatives discussed and agreed upon by:: Patient/family  Discharge Summary Short term goals set: See Care Plan Short term goals met: Complete Progress toward goals comments: One-to-one attended Which groups?: Self-esteem One-to-one attended: Self-esteem, time management Reason goals not met: N/A Therapeutic equipment acquired: None Reason patient discharged from therapy: Treatment goals met Pt/family agrees with progress & goals achieved: Yes Date patient discharged from therapy: 04/11/15   Leonette Monarch, LRT/CTRS 04/11/2015, 4:31 PM
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
## 2015-04-11 NOTE — Progress Notes (Signed)
Recreation Therapy Notes  Date: 07.14.16 Time: 3:00 pm Location: Craft Room  Group Topic: Leisure Education  Goal Area(s) Addresses:  Patient will identify activities for each letter of the alphabet. Patient will verbalize ability to integrate positive leisure into life post d/c. Patient will verbalize ability to use leisure as a coping mechanism.  Behavioral Response: Did not attend  Intervention: Leisure Alphabet  Activity: Patients were given a Leisure Alphabet worksheet and instructed to list a positive leisure activity for each letter of the alphabet.   Education: LRT educated patients on what is needed to participate in leisure activities.  Education Outcome: Patient did not attend group.   Clinical Observations/Feedback: Patient did not attend group.   Key Cen M, LRT/CTRS 04/11/2015 4:13 PM 
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
## 2015-04-11 NOTE — Plan of Care (Signed)
Problem: Ineffective individual coping Goal: STG: Patient will remain free from self harm Outcome: Progressing No self harm reported or observed Goal: STG-Increase in ability to manage activities of daily living Outcome: Completed/Met Date Met:  04/11/15 Able to manage Adls     

## 2015-04-12 NOTE — Progress Notes (Signed)
AVS H&P Discharge Summary faxed to [facility] for hospital follow-up °

## 2015-04-12 NOTE — Progress Notes (Signed)
  [facility]  Will you be returning to the same living situation after discharge:  Yes,  Home  At discharge, do you have transportation home?: Yes,  Niece  Do you have the ability to pay for your medications: Yes,  Medication mangament clinic  Release of information consent forms completed and in the chart;  Patient's signature needed at discharge.  Patient to Follow up at: Follow-up Information    Follow up with [facility] On 04/15/2015.   Why:  For your first appointment you will have a walk in appointment Monday, July 18th at 7:30am. Please take all of your hospital follow up paperwork.    Contact information:   [address] [place] [id number] [phone]       Patient denies SI/HI: Yes,  Yes    Safety Planning and Suicide Prevention discussed: Yes,  With patient and husband  Have you used any form of tobacco in the last 30 days? (Cigarettes, Smokeless Tobacco, Cigars, and/or Pipes): No  Has patient been referred to the Quitline?: N/A patient is not a smoker  Sempra EnergyCandace L Carson Meche MSW, LCSWA  04/12/2015, 9:53 AM

## 2015-04-12 NOTE — [facility] Group Notes (Signed)
[facility] Group Notes:  (Nursing/MHT/Case Management/Adjunct)  Date:  04/12/2015  Time:  12:09 PM  Type of Therapy:  Psychoeducational Skills  Participation Level:  Active  Participation Quality:  Appropriate and Attentive  Affect:  Appropriate  Cognitive:  Alert and Appropriate  Insight:  Appropriate  Engagement in Group:  Supportive  Modes of Intervention:  Support  Summary of Progress/Problems:  Susan Flowers 04/12/2015, 12:09 PM

## 2015-04-12 NOTE — Progress Notes (Signed)
Patient discharged ambulatory to home, accompanied by family member. Patient denies SI or HI. Discharge instructions reviewed with patient, she verbalizes understanding. Patient received copy of DC plan, prescriptions, 7 day supply of medications, and all personal belongings.

## 2015-04-12 NOTE — Progress Notes (Addendum)
D: Patient denies SI/HI/AVH.  Patient affect and mood are appropriate.  Patient feels that treatment here has been positive for her.  She was happy that she got to speak with her children.  Patient did attend evening group. Patient visible on the milieu. No distress noted. A: Support and encouragement offered. Scheduled medications given to pt. Q 15 min checks continued for patient safety. R: Patient receptive. Patient remains safe on the unit.

## 2015-09-25 ENCOUNTER — Emergency Department
Admission: EM | Admit: 2015-09-25 | Discharge: 2015-09-25 | Discharge status: Against Medical Advice | Payer: Self-pay | Attending: Emergency Medicine | Admitting: Emergency Medicine

## 2015-09-25 DIAGNOSIS — R220 Localized swelling, mass and lump, head: Secondary | ICD-10-CM | POA: Insufficient documentation

## 2015-09-25 NOTE — ED Notes (Signed)
Pt in with co red irritated area to face, unsure of cause.

## 2019-09-04 ENCOUNTER — Other Ambulatory Visit: Payer: Self-pay

## 2019-09-04 ENCOUNTER — Emergency Department
Admission: EM | Admit: 2019-09-04 | Discharge: 2019-09-04 | Disposition: A | Discharge status: Home / Self Care | Payer: Self-pay | Attending: Emergency Medicine | Admitting: Emergency Medicine

## 2019-09-04 ENCOUNTER — Emergency Department: Payer: Self-pay

## 2019-09-04 ENCOUNTER — Encounter: Payer: Self-pay | Admitting: Emergency Medicine

## 2019-09-04 DIAGNOSIS — K859 Acute pancreatitis without necrosis or infection, unspecified: Secondary | ICD-10-CM | POA: Insufficient documentation

## 2019-09-04 DIAGNOSIS — R110 Nausea: Secondary | ICD-10-CM | POA: Insufficient documentation

## 2019-09-04 DIAGNOSIS — R109 Unspecified abdominal pain: Secondary | ICD-10-CM

## 2019-09-04 LAB — TRIGLYCERIDES: Triglycerides: 57 mg/dL (ref <150)

## 2019-09-04 LAB — COMPREHENSIVE METABOLIC PANEL
ALT: 26 U/L (ref 0–44)
AST: 24 U/L (ref 15–41)
Albumin: 4.6 g/dL (ref 3.5–5.0)
Alkaline Phosphatase: 57 U/L (ref 38–126)
Anion gap: 10 (ref 5–15)
BUN: 12 mg/dL (ref 6–20)
CO2: 20 mmol/L — ABNORMAL LOW (ref 22–32)
Calcium: 9.3 mg/dL (ref 8.9–10.3)
Chloride: 105 mmol/L (ref 98–111)
Creatinine, Ser: 0.55 mg/dL (ref 0.44–1.00)
GFR calc Af Amer: >60 mL/min (ref >60)
GFR calc non Af Amer: >60 mL/min (ref >60)
Glucose, Bld: 96 mg/dL (ref 70–99)
Potassium: 4.1 mmol/L (ref 3.5–5.1)
Sodium: 135 mmol/L (ref 135–145)
Total Bilirubin: 1 mg/dL (ref 0.3–1.2)
Total Protein: 8.1 g/dL (ref 6.5–8.1)

## 2019-09-04 LAB — CBC
HCT: 42.2 % (ref 36.0–46.0)
Hemoglobin: 14.5 g/dL (ref 12.0–15.0)
MCH: 31 pg (ref 26.0–34.0)
MCHC: 34.4 g/dL (ref 30.0–36.0)
MCV: 90.4 fL (ref 80.0–100.0)
Platelets: 307 10*3/uL (ref 150–400)
RBC: 4.67 MIL/uL (ref 3.87–5.11)
RDW: 12.1 % (ref 11.5–15.5)
WBC: 11.4 10*3/uL — ABNORMAL HIGH (ref 4.0–10.5)
nRBC: 0 % (ref 0.0–0.2)

## 2019-09-04 LAB — URINALYSIS, COMPLETE (UACMP) WITH MICROSCOPIC
Bilirubin Urine: NEGATIVE
Glucose, UA: NEGATIVE mg/dL
Hgb urine dipstick: NEGATIVE
Ketones, ur: 5 mg/dL — AB
Leukocytes,Ua: NEGATIVE
Nitrite: NEGATIVE
Protein, ur: NEGATIVE mg/dL
Specific Gravity, Urine: 1.01 (ref 1.005–1.030)
pH: 6 (ref 5.0–8.0)

## 2019-09-04 LAB — POCT PREGNANCY, URINE: Preg Test, Ur: NEGATIVE

## 2019-09-04 LAB — LIPASE, BLOOD: Lipase: 151 U/L — ABNORMAL HIGH (ref 11–51)

## 2019-09-04 MED ORDER — HYDROMORPHONE HCL 1 MG/ML IJ SOLN
1.0000 mg | Freq: Once | INTRAMUSCULAR | Status: AC
  Administered 2019-09-04: 1 mg via INTRAVENOUS
  Filled 2019-09-04: qty 1

## 2019-09-04 MED ORDER — ONDANSETRON HCL 4 MG/2ML IJ SOLN
4.0000 mg | Freq: Once | INTRAMUSCULAR | Status: AC
  Administered 2019-09-04: 4 mg via INTRAVENOUS
  Filled 2019-09-04: qty 2

## 2019-09-04 MED ORDER — SODIUM CHLORIDE 0.9% FLUSH: 3.0000 mL | Freq: Once | INTRAVENOUS | Status: DC

## 2019-09-04 MED ORDER — OXYCODONE HCL 5 MG PO TABS: 5.0000 mg | ORAL_TABLET | Freq: Three times a day (TID) | ORAL | 0 refills | Status: AC | PRN

## 2019-09-04 MED ORDER — IOHEXOL 300 MG/ML  SOLN
100.0000 mL | Freq: Once | INTRAMUSCULAR | Status: AC | PRN
  Administered 2019-09-04: 100 mL via INTRAVENOUS
  Filled 2019-09-04: qty 100

## 2019-09-04 MED ORDER — FENTANYL CITRATE (PF) 100 MCG/2ML IJ SOLN
50.0000 ug | INTRAMUSCULAR | Status: DC | PRN
  Administered 2019-09-04: 50 ug via INTRAVENOUS
  Filled 2019-09-04: qty 2

## 2019-09-04 MED ORDER — SODIUM CHLORIDE 0.9 % IV BOLUS
1000.0000 mL | Freq: Once | INTRAVENOUS | Status: AC
  Administered 2019-09-04: 15:00:00 1000 mL via INTRAVENOUS

## 2019-09-04 MED ORDER — ONDANSETRON 4 MG PO TBDP: 4.0000 mg | ORAL_TABLET | Freq: Three times a day (TID) | ORAL | 0 refills | Status: AC | PRN

## 2019-09-04 MED ORDER — PANTOPRAZOLE SODIUM 40 MG IV SOLR
40.0000 mg | Freq: Once | INTRAVENOUS | Status: AC
  Administered 2019-09-04: 40 mg via INTRAVENOUS
  Filled 2019-09-04: qty 40

## 2019-09-04 NOTE — ED Notes (Signed)
See triage note at this time. Pt A&O x 4, respirations even and unlabored upon arrival to ED.

## 2019-09-04 NOTE — ED Provider Notes (Signed)
[facility] Emergency Department Provider Note  ____________________________________________   First MD Initiated Contact with Patient 09/04/19 1506     (approximate)  I have reviewed the triage vital signs and the nursing notes.   HISTORY  Chief Complaint Abdominal Pain    HPI Susan Flowers is a 33 y.o. female who presents with abdominal pain.  Patient is had a total of 4 C-sections who now presents with abdominal pain.  Her pain is severe, constant, onset today, nothing makes it better including some ibuprofen, nothing makes it worse. Mostly on the Left side of abdomen.  Denies any daily Motrin use or alcohol use.  She denies have anything like this previously.  She has had some associated nausea with it but no vomiting.  Denies any fevers.  Denies ever having symptoms like this previously.           Past Medical History:  Diagnosis Date   Gestational diabetes     Patient Active Problem List   Diagnosis Date Noted   Major depressive disorder, recurrent severe without psychotic features ([place]) 04/08/2015    Past Surgical History:  Procedure Laterality Date   ceserean section      Prior to Admission medications   Medication Sig Start Date End Date Taking? Authorizing Provider  hydrOXYzine (ATARAX/VISTARIL) 50 MG tablet Take 1 tablet (50 mg total) by mouth at bedtime as needed (insomnia). 04/11/15   Hildred Priest, MD  sertraline (ZOLOFT) 100 MG tablet Take 1 tablet (100 mg total) by mouth daily. 04/11/15   Hildred Priest, MD    Allergies Patient has no known allergies.  No family history on file.  Social History Social History   Tobacco Use   Smoking status: Never Smoker   Smokeless tobacco: Never Used  Substance Use Topics   Alcohol use: Yes    Comment: occassional   Drug use: No      Review of Systems Constitutional: No fever/chills Eyes: No visual changes. ENT: No sore  throat. Cardiovascular: Denies chest pain. Respiratory: Denies shortness of breath. Gastrointestinal: Positive left-sided abdominal pain.  No nausea, no vomiting.  No diarrhea.  No constipation. Genitourinary: Negative for dysuria. Musculoskeletal: Negative for back pain. Skin: Negative for rash. Neurological: Negative for headaches, focal weakness or numbness. All other ROS negative ____________________________________________   PHYSICAL EXAM:  VITAL SIGNS: ED Triage Vitals  Enc Vitals Group     BP 09/04/19 1326 134/86     Pulse Rate 09/04/19 1326 (!) 106     Resp 09/04/19 1326 (!) 22     Temp 09/04/19 1326 98.5 F (36.9 C)     Temp Source 09/04/19 1326 Oral     SpO2 09/04/19 1326 97 %     Weight 09/04/19 1323 228 lb (103.4 kg)     Height 09/04/19 1323 5\' 5"  (1.651 m)     Head Circumference --      Peak Flow --      Pain Score 09/04/19 1323 10     Pain Loc --      Pain Edu? --      Excl. in [place]? --     Constitutional: Alert and oriented.  Appears in pain pacing the room. Eyes: Conjunctivae are normal. EOMI. Head: Atraumatic. Nose: No congestion/rhinnorhea. Mouth/Throat: Mucous membranes are moist.   Neck: No stridor. Trachea Midline. FROM Cardiovascular: Normal rate, regular rhythm. Grossly normal heart sounds.  Good peripheral circulation. Respiratory: Normal respiratory effort.  No retractions. Lungs CTAB. Gastrointestinal: Left-sided  abdominal tenderness throughout.  No rebound.  No guarding.. No distention. No abdominal bruits.  Musculoskeletal: No lower extremity tenderness nor edema.  No joint effusions. Neurologic:  Normal speech and language. No gross focal neurologic deficits are appreciated.  Skin:  Skin is warm, dry and intact. No rash noted. Psychiatric: Mood and affect are normal. Speech and behavior are normal. GU: Deferred   ____________________________________________   LABS (all labs ordered are listed, but only abnormal results are  displayed)  Labs Reviewed  LIPASE, BLOOD - Abnormal; Notable for the following components:      Result Value   Lipase 151 (*)    All other components within normal limits  COMPREHENSIVE METABOLIC PANEL - Abnormal; Notable for the following components:   CO2 20 (*)    All other components within normal limits  CBC - Abnormal; Notable for the following components:   WBC 11.4 (*)    All other components within normal limits  URINALYSIS, COMPLETE (UACMP) WITH MICROSCOPIC - Abnormal; Notable for the following components:   Color, Urine YELLOW (*)    APPearance CLEAR (*)    Ketones, ur 5 (*)    Bacteria, UA RARE (*)    All other components within normal limits  TRIGLYCERIDES  POC URINE PREG, ED  POCT PREGNANCY, URINE   ____________________________________________  RADIOLOGY   Official radiology report(s): Ct Abdomen Pelvis W Contrast  Result Date: 09/04/2019 CLINICAL DATA:  Abdominal cramping began this morning, sudden onset, intermittently worsening EXAM: CT ABDOMEN AND PELVIS WITH CONTRAST TECHNIQUE: Multidetector CT imaging of the abdomen and pelvis was performed using the standard protocol following bolus administration of intravenous contrast. CONTRAST:  OMNIPAQUE IOHEXOL 300 MG/ML  SOLN COMPARISON:  None FINDINGS: Lower chest: Lung bases are clear. Normal heart size. No pericardial effusion. Hepatobiliary: No focal liver abnormality is seen. No gallstones, gallbladder wall thickening, or biliary dilatation. Pancreas: Unremarkable. No pancreatic ductal dilatation or surrounding inflammatory changes. Spleen: Normal in size without focal abnormality. Adrenals/Urinary Tract: Adrenal glands are unremarkable. Kidneys are normal, without renal calculi, focal lesion, or hydronephrosis. Urinary bladder is largely decompressed at the time of exam and therefore poorly evaluated by CT imaging. Stomach/Bowel: Distal esophagus, stomach and duodenal sweep are unremarkable. No small bowel wall  thickening or dilatation. No evidence of obstruction. Normal appendix courses towards midline terminating along the dorsal aspect of the uterus. No colonic dilatation or wall thickening. Vascular/Lymphatic: The aorta is normal caliber. No suspicious or enlarged lymph nodes in the included lymphatic chains. Reproductive: Anteverted uterus. Endometrial thickness at the upper limits of normal. Collapsing crenulated corpus luteum in the right ovary. Small volume free fluid in the right adnexa and posterior cul-de-sac. No concerning left adnexal lesion. Other: Small volume free fluid in the pelvis, as above. No free air. No bowel containing hernias. Musculoskeletal: Multilevel degenerative changes are present in the imaged portions of the spine. No acute osseous abnormality or suspicious osseous lesion. Degenerative changes in SI joints and symphysis pubis. IMPRESSION: 1. Collapsing crenulated corpus luteum in the right ovary with small volume free fluid in the right adnexa and posterior cul-de-sac, likely physiologic though could consider evaluation with pelvic ultrasound if there is clinical ambiguity. 2. Endometrial thickness at the upper limits of normal. 3. No other acute abnormality in the abdomen or pelvis. Specifically, normal appendix. 4. Bladder wall thickening likely related to underdistention though consider urinalysis if there is clinical concern for cystitis. Electronically Signed   By: Kreg Shropshire M.D.   On: 09/04/2019 15:50  [place] Abdomen Limited Ruq  Result Date: 09/04/2019 CLINICAL DATA:  Abdominal pain for 1 day EXAM: ULTRASOUND ABDOMEN LIMITED RIGHT UPPER QUADRANT COMPARISON:  None. FINDINGS: Gallbladder: No gallstones or wall thickening visualized. No sonographic Murphy sign noted by sonographer. Common bile duct: Diameter: 3.7 mm Liver: No focal lesion identified. Within normal limits in parenchymal echogenicity. Portal vein is patent on color Doppler imaging with normal direction of blood flow  towards the liver. Other: None. IMPRESSION: No acute abnormality noted. Electronically Signed   By: Alcide CleverMark  Lukens M.D.   On: 09/04/2019 16:10    ____________________________________________   PROCEDURES  Procedure(s) performed (including Critical Care):  Procedures   ____________________________________________   INITIAL IMPRESSION / ASSESSMENT AND PLAN / ED COURSE  Susan Flowers was evaluated in Emergency Department on 09/04/2019 for the symptoms described in the history of present illness. She was evaluated in the context of the global COVID-19 pandemic, which necessitated consideration that the patient might be at risk for infection with the SARS-CoV-2 virus that causes COVID-19. Institutional protocols and algorithms that pertain to the evaluation of patients at risk for COVID-19 are in a state of rapid change based on information released by regulatory bodies including the CDC and federal and state organizations. These policies and algorithms were followed during the patient's care in the ED.    Patient is a 33 year old who presents with left-sided abdominal pain.  Will get labs to evaluate for pancreatitis, cholecystitis, electrolyte abnormalities, AKI.  Given patient significant pain will get CT scan to ensure there is no evidence of ulcer perforation or pancreatic necrosis.  Also get ultrasound to evaluate for gallbladder said could be causing her elevated lipase.  She denies any shortness of breath or chest pain to suggest ACS, PE, pneumonia   Lipase slightly elevated at 151.  White count is also slightly elevated 11.4.  Ultrasound was negative.  CT scan was negative as well.  Did comment on an abnormality on her right ovary will be corpus luteum.  Reevaluated patient she is not tender there also I think that is less likely to be causing her pain on the left side of her abdomen.  I discussed this finding with patient and she can follow-up with her outpatient.  Also comment  on some bladder wall thickening, patient's urine was negative for UTI.  Patient's lipase is 3 times normal and she is having pain right over her pancreas. Although CT does not show any signs of pancreatitis suspect that she has an early mild pancreatitis.  I discussed admission for pain control versus going home and patient would prefer to go home.  Patient's abdomen is much better than previous.  She is soft and nontender and has a reassuring exam.  We will give a course of oxycodone, we discussed a diet staying well-hydrated.  Patient expressed understanding felt comfortable with this plan.  She will return to the ER if develops any other concerns  I discussed the provisional nature of ED diagnosis, the treatment so far, the ongoing plan of care, follow up appointments and return precautions with the patient and any family or support people present. They expressed understanding and agreed with the plan, discharged home.   Triglyceride is pending and she will f/u with. She denies alcohol use.     ____________________________________________   FINAL CLINICAL IMPRESSION(S) / ED DIAGNOSES   Final diagnoses:  Abdominal pain  Acute pancreatitis, unspecified complication status, unspecified pancreatitis type      MEDICATIONS GIVEN DURING THIS  VISIT:  Medications  sodium chloride flush (NS) 0.9 % injection 3 mL (3 mLs Intravenous Not Given 09/04/19 1536)  sodium chloride 0.9 % bolus 1,000 mL (1,000 mLs Intravenous New Bag/Given 09/04/19 1525)  HYDROmorphone (DILAUDID) injection 1 mg (1 mg Intravenous Given 09/04/19 1526)  ondansetron (ZOFRAN) injection 4 mg (4 mg Intravenous Given 09/04/19 1525)  pantoprazole (PROTONIX) injection 40 mg (40 mg Intravenous Given 09/04/19 1611)  iohexol (OMNIPAQUE) 300 MG/ML solution 100 mL (100 mLs Intravenous Contrast Given 09/04/19 1536)     ED Discharge Orders         Ordered    oxyCODONE (ROXICODONE) 5 MG immediate release tablet  Every 8 hours PRN      09/04/19 1700    ondansetron (ZOFRAN ODT) 4 MG disintegrating tablet  Every 8 hours PRN     09/04/19 1700           Note:  This document was prepared using Dragon voice recognition software and may include unintentional dictation errors.   Concha Se, MD 09/04/19 [phone]

## 2019-09-04 NOTE — ED Notes (Signed)
Called LAB spoke to [place] she is going to ADD ON triglycerides to chemistry earlier.

## 2019-09-04 NOTE — ED Notes (Signed)
Back from ct/US   Family at bedside  States pain is much better  Taking sips of water  Tolerated well

## 2019-09-04 NOTE — ED Notes (Signed)
Pain medications administered by this RN after verifying with patient that she did not drive herself. Pt placed in recliner in sub wait area. Call bell within reach at this time. Pt given warm blanket per her request.

## 2019-09-04 NOTE — Discharge Instructions (Addendum)
Your CT scan was negative for acute pathology although there is a finding you to follow-up with OB/GYN for.  I suspect you have a mild case of pancreatitis.  You can take 1 g of Tylenol every 8 hours and use oxycodone for breakthrough pain.  Use the Zofran to help with nausea.  Return to the ER for worsening pain, fevers, vomiting or any other concerns.  Follow-up with your primary care doctor in 2 days.  Start off with clear diet.  When restarting diet, eat small, low-fat meals and gradually advance over 3 to 6 days as tolerated Avoid alcohol.   1. Collapsing crenulated corpus luteum in the right ovary with small volume free fluid in the right adnexa and posterior cul-de-sac, likely physiologic though could consider evaluation with pelvic ultrasound if there is clinical ambiguity. FOLLOW UP WITH OBGYN 2. Endometrial thickness at the upper limits of normal. 3. No other acute abnormality in the abdomen or pelvis. Specifically, normal appendix. 4. Bladder wall thickening likely related to underdistention though consider urinalysis if there is clinical concern for cystitis.

## 2019-09-04 NOTE — ED Triage Notes (Signed)
Pt presents to ED via POV with c/o abdominal cramping that started this morning with sudden onset. Pt states lower abdominal cramping that is intermittently worse. Pt noted to be tearful in triage. Pt states at 1230 took 600mg  Ibuprofen PTA. Pt states attempted to drink water and it made her nauseated. Pt states pain worse with sitting, denies urinary symptoms at this time.

## 2020-04-10 IMAGING — CT CT ABD-PELV W/ CM
2 of 4 series · 15 of 46 positions shown, 17 images · IV contrast (APPLIED)
Comparison: None

CLINICAL DATA: Abdominal cramping began this morning, sudden onset,
intermittently worsening

EXAM:
CT ABDOMEN AND PELVIS WITH CONTRAST
TECHNIQUE: Multidetector CT imaging of the abdomen and pelvis was performed
using the standard protocol following bolus administration of
intravenous contrast.
CONTRAST:  100mL OMNIPAQUE IOHEXOL 300 MG/ML  SOLN

[Series 2: axial st · axial · 0.79mm/px · z∈[-454,-38]mm · 12 of 91 slices shown, 14 images]
[im 4/91  soft-tissue]
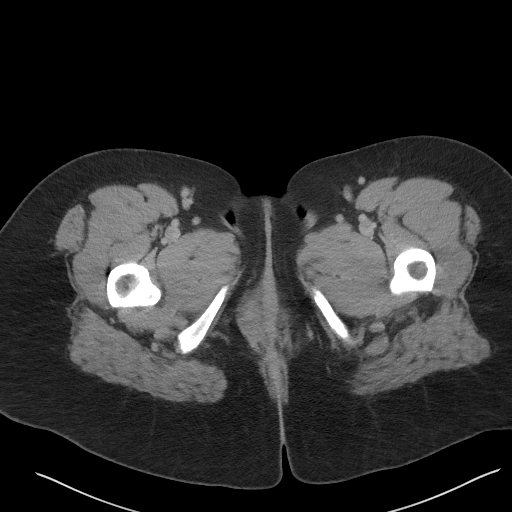
[im 4/91  bone]
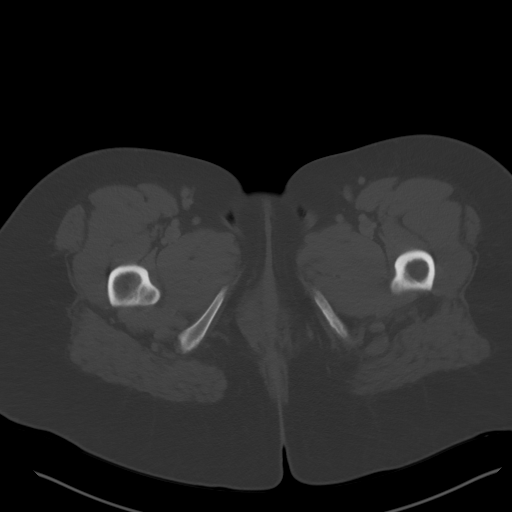
[im 12/91  soft-tissue]
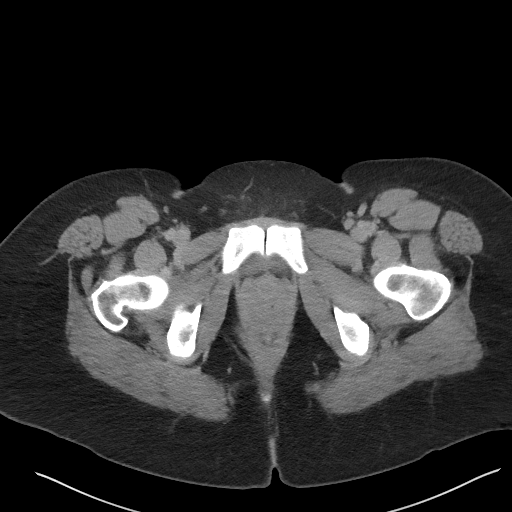
[im 19/91  soft-tissue]
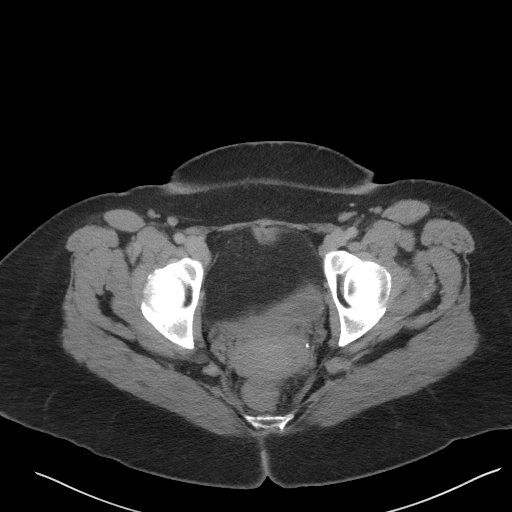
[im 27/91  soft-tissue]
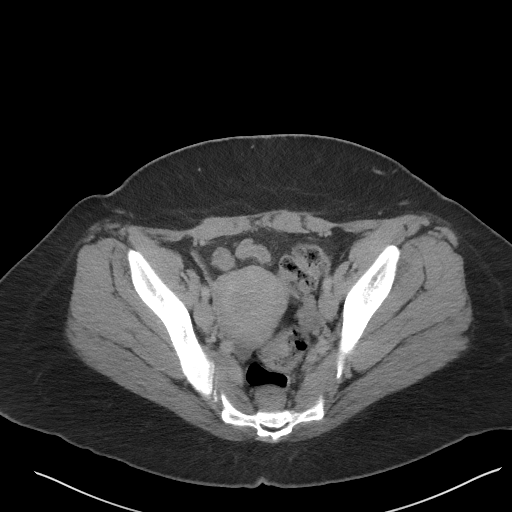
[im 34/91  soft-tissue]
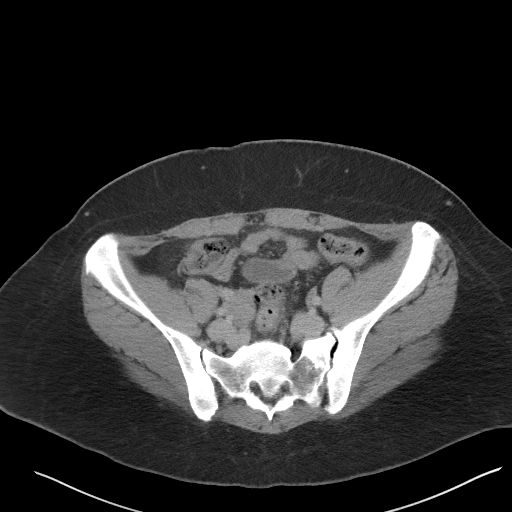
[im 42/91  soft-tissue]
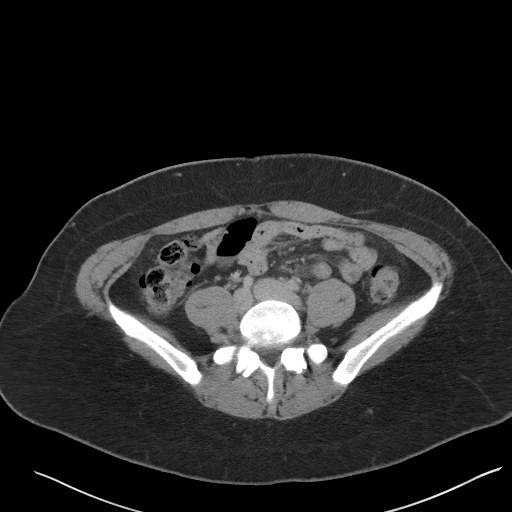
[im 49/91  soft-tissue]
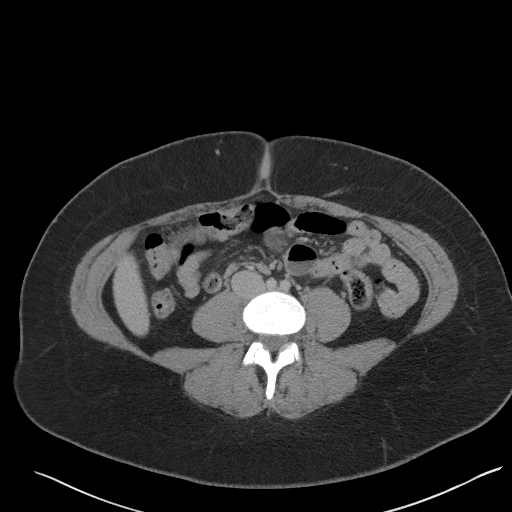
[im 57/91  soft-tissue]
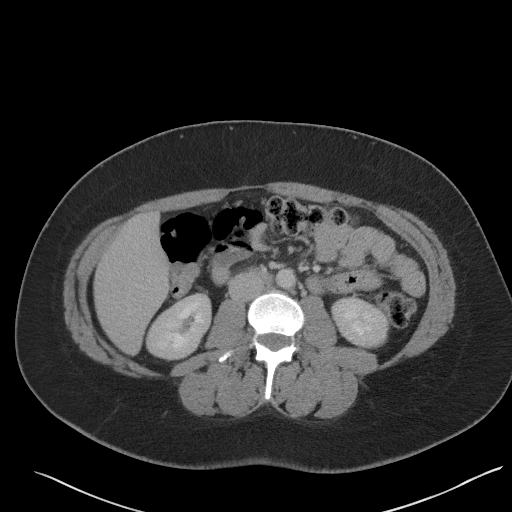
[im 64/91  soft-tissue]
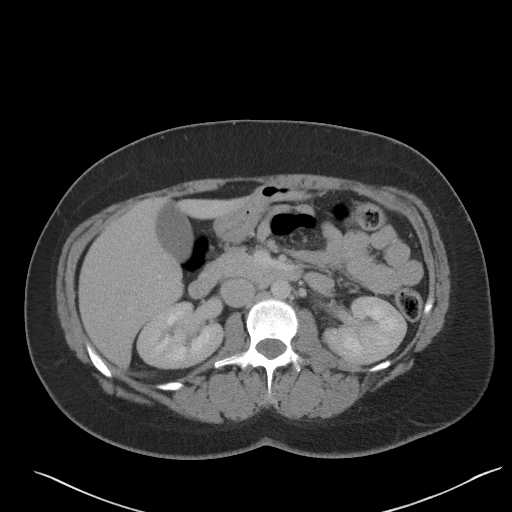
[im 64/91  bone]
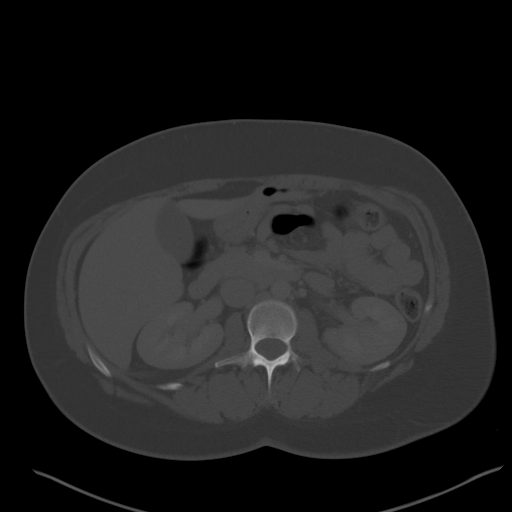
[im 72/91  soft-tissue]
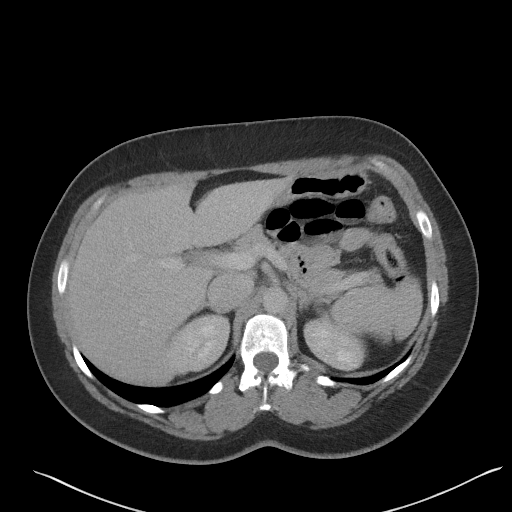
[im 79/91  soft-tissue]
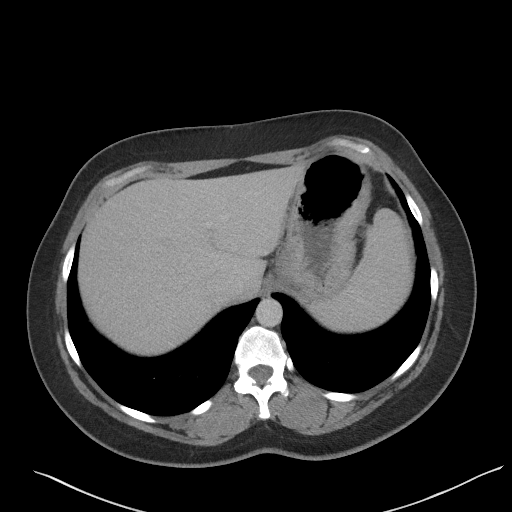
[im 87/91  soft-tissue]
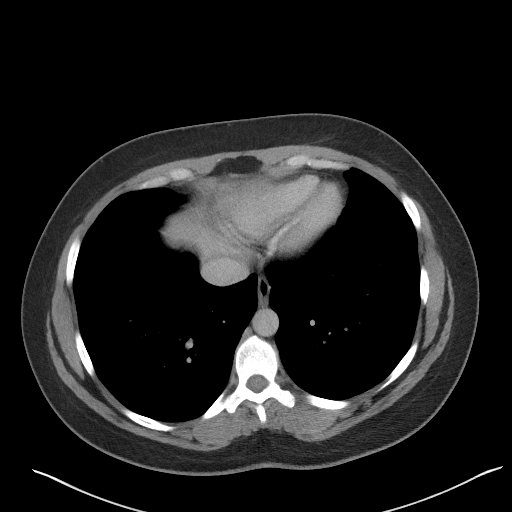

[Series 5: coronal st · coronal · 0.68mm/px · 3 of 99 slices shown]
[im 33/99  soft-tissue]
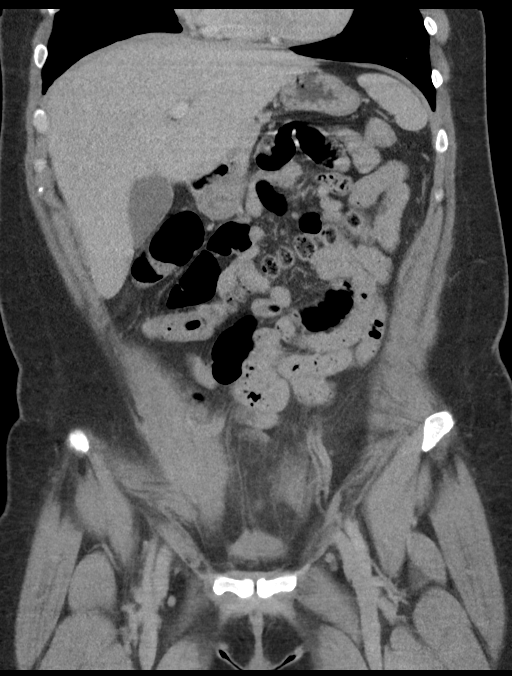
[im 44/99  soft-tissue]
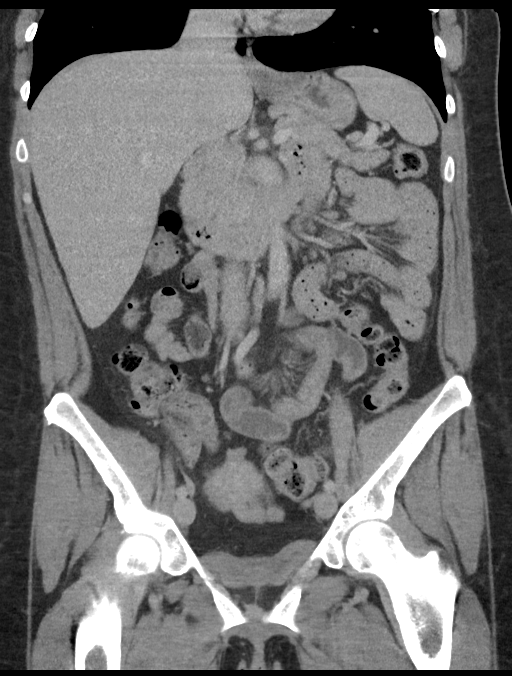
[im 55/99  soft-tissue]
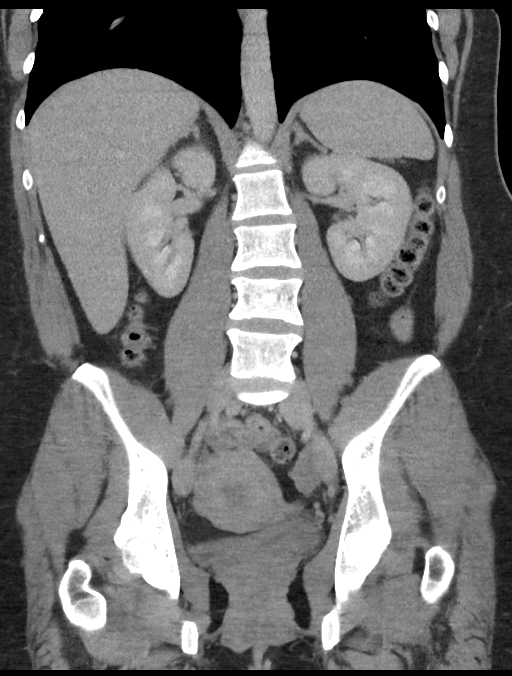

[15 of 46 positions shown; findings below may reference images not displayed]

FINDINGS: Lower chest: Lung bases are clear. Normal heart size. No pericardial
effusion.

Hepatobiliary: No focal liver abnormality is seen. No gallstones,
gallbladder wall thickening, or biliary dilatation.

Pancreas: Unremarkable. No pancreatic ductal dilatation or
surrounding inflammatory changes.

Spleen: Normal in size without focal abnormality.

Adrenals/Urinary Tract: Adrenal glands are unremarkable. Kidneys are
normal, without renal calculi, focal lesion, or hydronephrosis.
Urinary bladder is largely decompressed at the time of exam and
therefore poorly evaluated by CT imaging.

Stomach/Bowel: Distal esophagus, stomach and duodenal sweep are
unremarkable. No small bowel wall thickening or dilatation. No
evidence of obstruction. Normal appendix courses towards midline
terminating along the dorsal aspect of the uterus. No colonic
dilatation or wall thickening.

Vascular/Lymphatic: The aorta is normal caliber. No suspicious or
enlarged lymph nodes in the included lymphatic chains.

Reproductive: Anteverted uterus. Endometrial thickness at the upper
limits of normal. Collapsing crenulated corpus luteum in the right
ovary. Small volume free fluid in the right adnexa and posterior
cul-de-sac. No concerning left adnexal lesion.

Other: Small volume free fluid in the pelvis, as above. No free air.
No bowel containing hernias.

Musculoskeletal: Multilevel degenerative changes are present in the
imaged portions of the spine. No acute osseous abnormality or
suspicious osseous lesion. Degenerative changes in SI joints and
symphysis pubis.
IMPRESSION: 1. Collapsing crenulated corpus luteum in the right ovary with small
volume free fluid in the right adnexa and posterior cul-de-sac,
likely physiologic though could consider evaluation with pelvic
ultrasound if there is clinical ambiguity.
2. Endometrial thickness at the upper limits of normal.
3. No other acute abnormality in the abdomen or pelvis.
Specifically, normal appendix.
4. Bladder wall thickening likely related to underdistention though
consider urinalysis if there is clinical concern for cystitis.

## 2020-04-10 IMAGING — US US ABDOMEN LIMITED
1 series · 14 of 22 positions shown · non-contrast
Comparison: None.

CLINICAL DATA: Abdominal pain for 1 day

EXAM:
ULTRASOUND ABDOMEN LIMITED RIGHT UPPER QUADRANT

[Series 1: us abdomen limited · 14 of 22 slices shown]
[im 1/22]
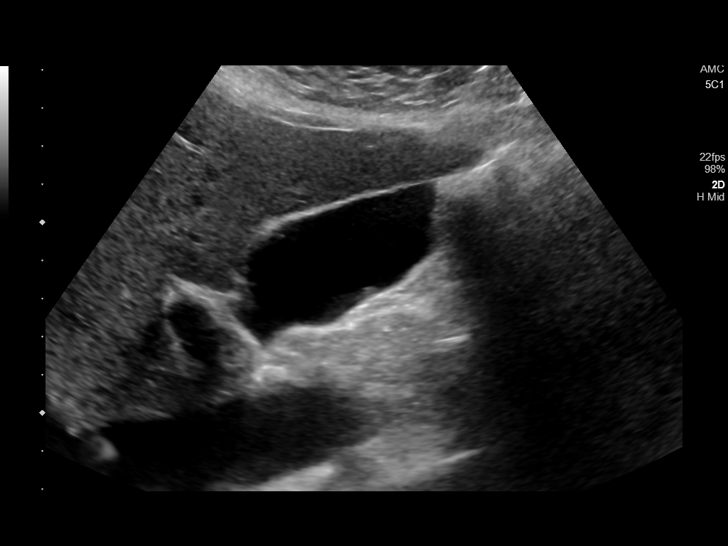
[im 3/22]
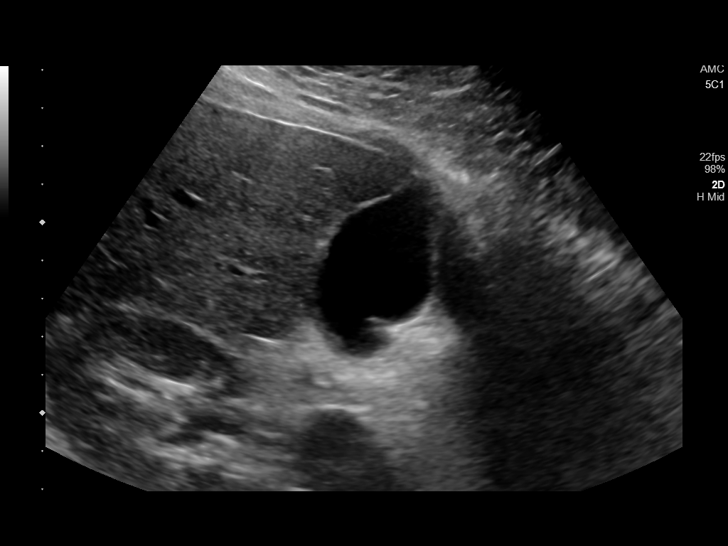
[im 4/22]
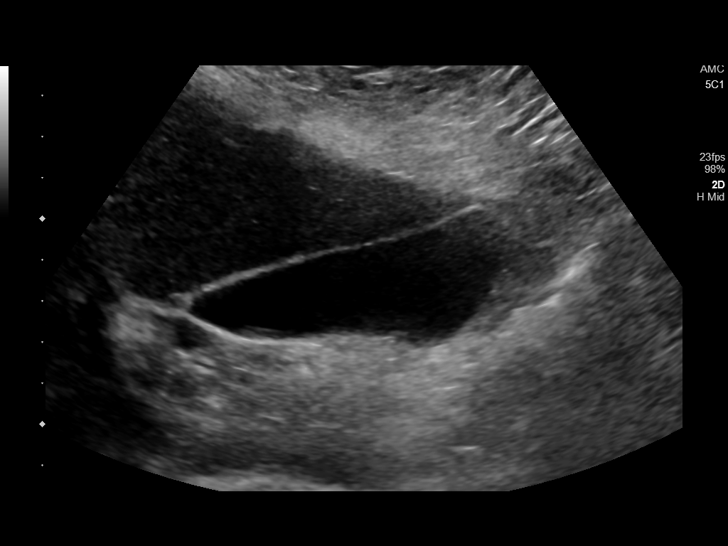
[im 6/22]
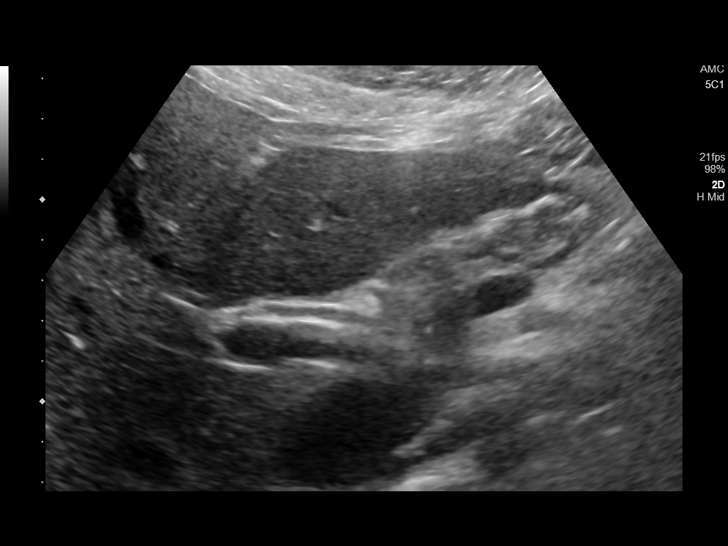
[im 8/22]
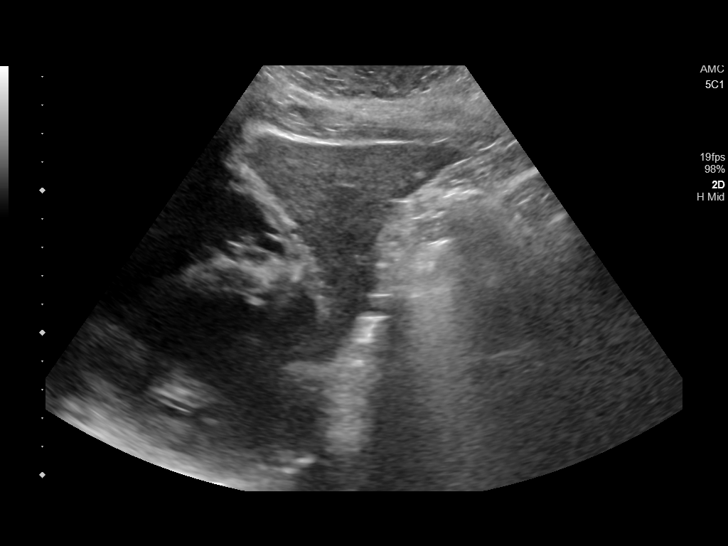
[im 9/22]
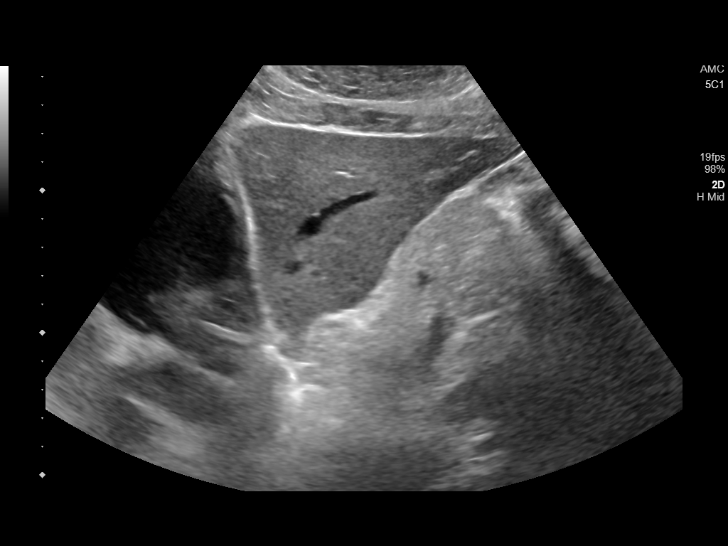
[im 11/22]
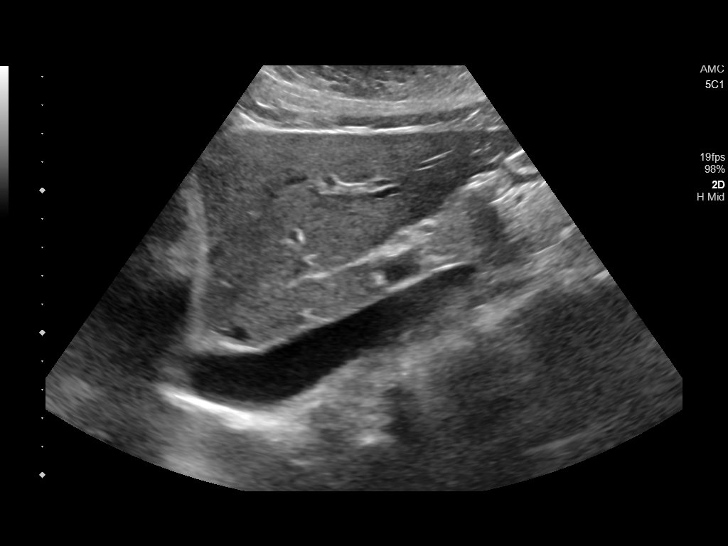
[im 12/22]
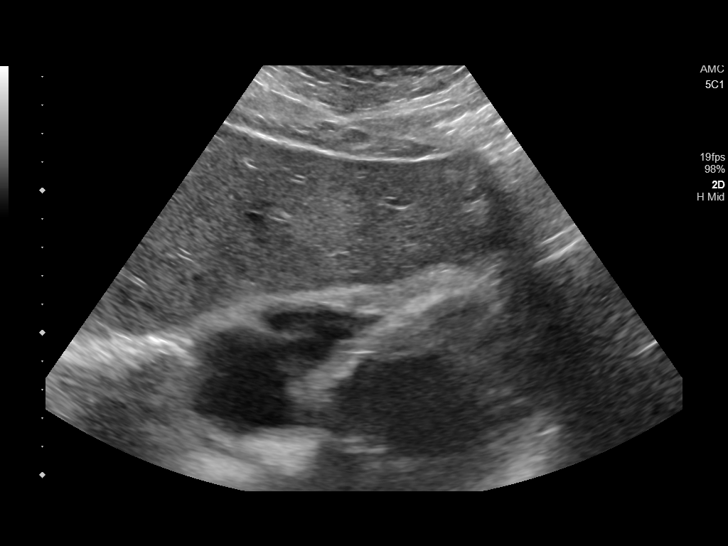
[im 14/22]
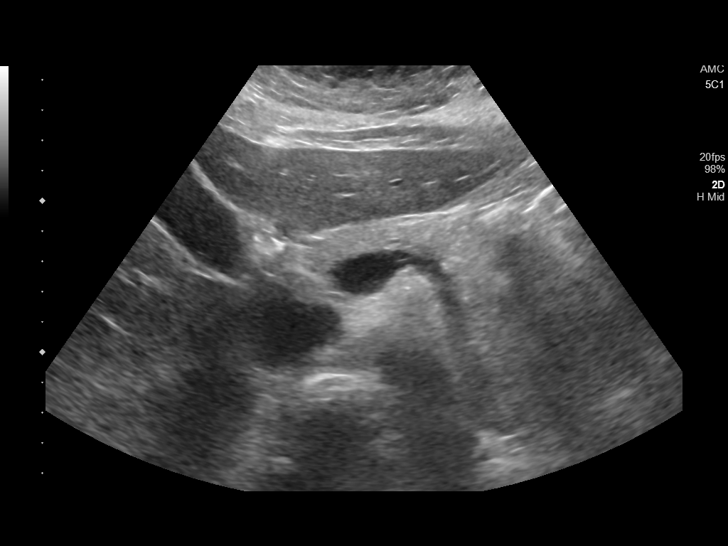
[im 15/22]
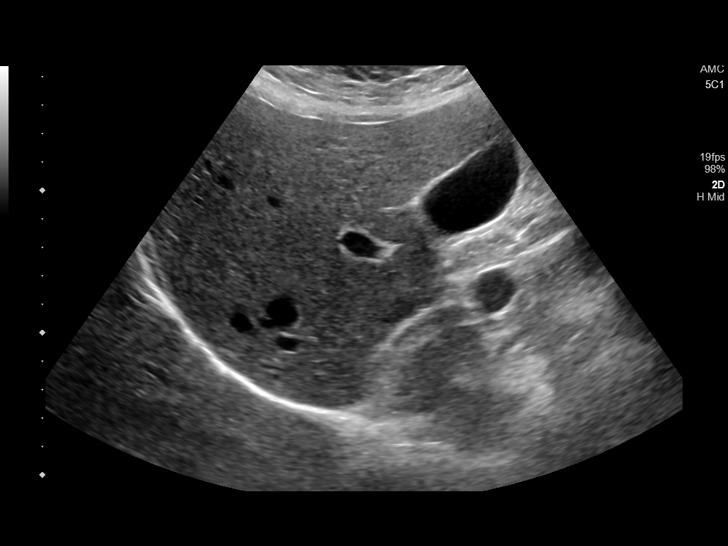
[im 17/22]
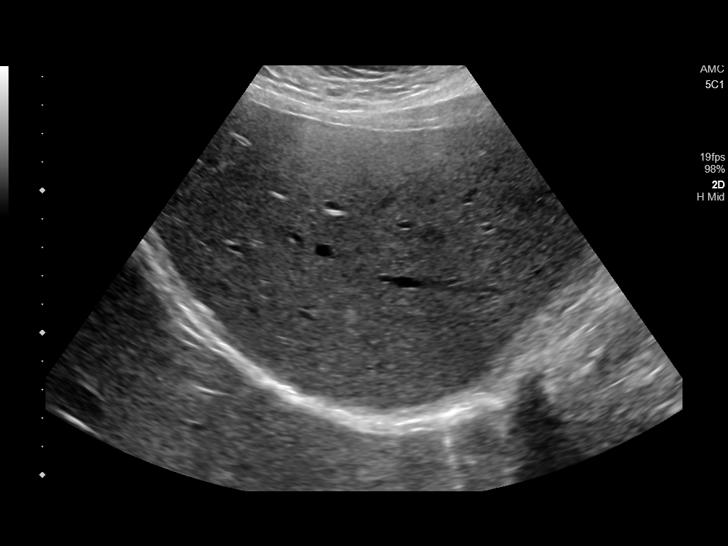
[im 19/22]
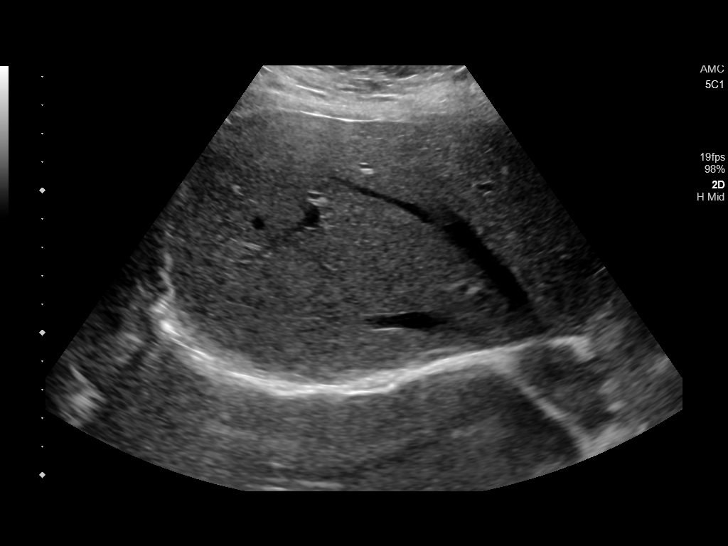
[im 20/22]
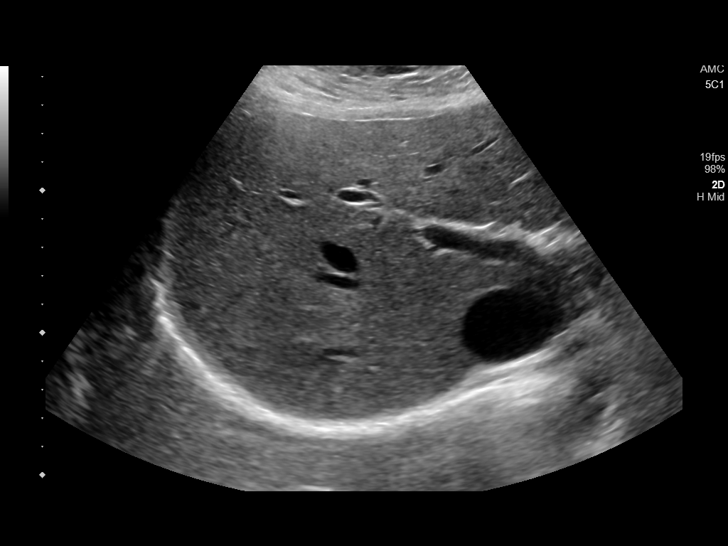
[im 22/22]
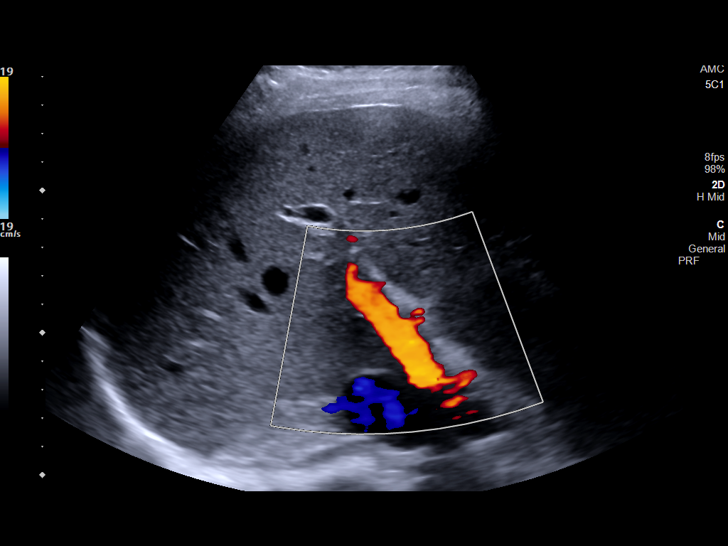

[14 of 22 positions shown; findings below may reference images not displayed]

FINDINGS: Gallbladder:

No gallstones or wall thickening visualized. No sonographic Murphy
sign noted by sonographer.

Common bile duct:

Diameter: 3.7 mm

Liver:

No focal lesion identified. Within normal limits in parenchymal
echogenicity. Portal vein is patent on color Doppler imaging with
normal direction of blood flow towards the liver.

Other: None.
IMPRESSION: No acute abnormality noted.

## 2024-07-11 ENCOUNTER — Other Ambulatory Visit: Payer: Self-pay | Admitting: Obstetrics and Gynecology

## 2024-07-11 DIAGNOSIS — N6002 Solitary cyst of left breast: Secondary | ICD-10-CM

## 2024-07-12 ENCOUNTER — Ambulatory Visit
Admission: RE | Admit: 2024-07-12 | Discharge: 2024-07-12 | Disposition: A | Discharge status: Home / Self Care | Payer: Self-pay | Source: Ambulatory Visit | Attending: Obstetrics and Gynecology | Admitting: Obstetrics and Gynecology

## 2024-07-12 DIAGNOSIS — N6002 Solitary cyst of left breast: Secondary | ICD-10-CM

## 2024-07-14 ENCOUNTER — Other Ambulatory Visit: Payer: Self-pay | Admitting: Obstetrics and Gynecology

## 2024-07-14 DIAGNOSIS — R928 Other abnormal and inconclusive findings on diagnostic imaging of breast: Secondary | ICD-10-CM

## 2024-07-19 ENCOUNTER — Ambulatory Visit
Admission: RE | Admit: 2024-07-19 | Discharge: 2024-07-19 | Disposition: A | Discharge status: Home / Self Care | Payer: Self-pay | Source: Ambulatory Visit | Attending: Obstetrics and Gynecology | Admitting: Obstetrics and Gynecology

## 2024-07-19 DIAGNOSIS — R928 Other abnormal and inconclusive findings on diagnostic imaging of breast: Secondary | ICD-10-CM | POA: Insufficient documentation

## 2024-07-19 DIAGNOSIS — D242 Benign neoplasm of left breast: Secondary | ICD-10-CM | POA: Insufficient documentation

## 2024-07-19 HISTORY — PX: BREAST BIOPSY: SHX20

## 2024-07-19 MED ORDER — LIDOCAINE 1 % OPTIME INJ - NO CHARGE
5.0000 mL | Freq: Once | INTRAMUSCULAR | Status: AC
  Administered 2024-07-19: 5 mL
  Filled 2024-07-19: qty 6

## 2024-07-19 MED ORDER — LIDOCAINE 1 % OPTIME INJ - NO CHARGE
5.0000 mL | Freq: Once | INTRAMUSCULAR | Status: DC
  Filled 2024-07-19: qty 6

## 2024-07-19 MED ORDER — LIDOCAINE-EPINEPHRINE 1 %-1:100000 IJ SOLN
10.0000 mL | Freq: Once | INTRAMUSCULAR | Status: AC
Start: 2024-07-19 — End: 2024-07-19
  Administered 2024-07-19: 10 mL via INTRADERMAL

## 2024-07-20 LAB — SURGICAL PATHOLOGY
